# Patient Record
Sex: Female | Born: 1980 | Race: White | Hispanic: No | Marital: Married | Smoking: Never smoker
Health system: Southern US, Community
[De-identification: ages and names within clinical notes are randomized; demographics above are authoritative.]

## PROBLEM LIST (undated history)

## (undated) DIAGNOSIS — Z87448 Personal history of other diseases of urinary system: Secondary | ICD-10-CM

## (undated) DIAGNOSIS — T7840XA Allergy, unspecified, initial encounter: Secondary | ICD-10-CM

## (undated) DIAGNOSIS — Z8619 Personal history of other infectious and parasitic diseases: Secondary | ICD-10-CM

## (undated) DIAGNOSIS — K602 Anal fissure, unspecified: Secondary | ICD-10-CM

## (undated) HISTORY — DX: Anal fissure, unspecified: K60.2

## (undated) HISTORY — DX: Allergy, unspecified, initial encounter: T78.40XA

## (undated) HISTORY — DX: Personal history of other diseases of urinary system: Z87.448

## (undated) HISTORY — PX: NO PAST SURGERIES: SHX2092

## (undated) HISTORY — PX: COLONOSCOPY: SHX174

## (undated) HISTORY — DX: Personal history of other infectious and parasitic diseases: Z86.19

## (undated) HISTORY — PX: WISDOM TOOTH EXTRACTION: SHX21

---
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
## 2001-01-04 ENCOUNTER — Inpatient Hospital Stay (HOSPITAL_COMMUNITY): Admission: AD | Admit: 2001-01-04 | Discharge: 2001-01-06 | Payer: Self-pay | Admitting: Obstetrics and Gynecology

## 2002-01-03 ENCOUNTER — Ambulatory Visit (HOSPITAL_COMMUNITY): Admission: RE | Admit: 2002-01-03 | Discharge: 2002-01-03 | Payer: Self-pay | Admitting: Obstetrics and Gynecology

## 2002-01-03 ENCOUNTER — Encounter: Payer: Self-pay | Admitting: Obstetrics and Gynecology

## 2002-04-09 ENCOUNTER — Inpatient Hospital Stay (HOSPITAL_COMMUNITY): Admission: AD | Admit: 2002-04-09 | Discharge: 2002-04-11 | Payer: Self-pay | Admitting: Obstetrics and Gynecology

## 2004-04-27 ENCOUNTER — Other Ambulatory Visit: Admission: RE | Admit: 2004-04-27 | Discharge: 2004-04-27 | Payer: Self-pay | Admitting: Obstetrics and Gynecology

## 2005-01-27 ENCOUNTER — Other Ambulatory Visit: Admission: RE | Admit: 2005-01-27 | Discharge: 2005-01-27 | Payer: Self-pay | Admitting: Obstetrics and Gynecology

## 2015-04-23 LAB — OB RESULTS CONSOLE GC/CHLAMYDIA
Chlamydia: NEGATIVE
Gonorrhea: NEGATIVE

## 2015-04-23 LAB — OB RESULTS CONSOLE RUBELLA ANTIBODY, IGM: Rubella: IMMUNE

## 2015-04-23 LAB — OB RESULTS CONSOLE ABO/RH: RH Type: POSITIVE

## 2015-04-23 LAB — OB RESULTS CONSOLE HEPATITIS B SURFACE ANTIGEN: Hepatitis B Surface Ag: NEGATIVE

## 2015-04-23 LAB — OB RESULTS CONSOLE HIV ANTIBODY (ROUTINE TESTING): HIV: NONREACTIVE

## 2015-04-23 LAB — OB RESULTS CONSOLE RPR: RPR: NONREACTIVE

## 2015-04-23 LAB — OB RESULTS CONSOLE ANTIBODY SCREEN: Antibody Screen: NEGATIVE

## 2015-08-30 NOTE — L&D Delivery Note (Signed)
Delivery Note Pt pushed well about 30 minutes and at 7:31 PM a viable female was delivered via Vaginal, Spontaneous Delivery (Presentation: Right Occiput Anterior).  APGAR: 8, 9; weight  .   Placenta status: delivered with partial manual extraction, fundal check done after delivery with no residual placenta felt but fundus was very firm and clamped down.  Cord: 3 vessels with the following complications: Tight double nuchal delivered through.  Anesthesia: Epidural  Episiotomy:  none Lacerations: none  Suture Repair: n/a Est. Blood Loss (mL):  2107ml  Mom to postpartum.  Baby to Couplet care / Skin to Skin. D/w pt and husband circumcision and they desire to proceed.  Logan Bores 11/26/2015, 7:54 PM

## 2015-11-18 LAB — OB RESULTS CONSOLE GBS: GBS: POSITIVE

## 2015-11-22 ENCOUNTER — Inpatient Hospital Stay (HOSPITAL_COMMUNITY): Admission: AD | Admit: 2015-11-22 | Payer: Self-pay | Source: Ambulatory Visit | Admitting: Obstetrics and Gynecology

## 2015-11-23 ENCOUNTER — Telehealth (HOSPITAL_COMMUNITY): Payer: Self-pay | Admitting: *Deleted

## 2015-11-23 ENCOUNTER — Encounter (HOSPITAL_COMMUNITY): Payer: Self-pay | Admitting: *Deleted

## 2015-11-23 NOTE — Telephone Encounter (Signed)
Preadmission screen  

## 2015-11-25 NOTE — H&P (Signed)
Heather Nixon is a 35 y.o. female  G3P2002 at 103 4/7 weeks (EDD 11/22/15 by LMP c/w 9 week [place])   presenting for IOL at term with favorable cervix.  Prenatal care uncomplicated except GBS positive.  Maternal Medical History:  Contractions: Frequency: irregular.   Perceived severity is mild.    Fetal activity: Perceived fetal activity is normal.    Prenatal Complications - Diabetes: none.    OB History    Gravida Para Term Preterm AB TAB SAB Ectopic Multiple Living   3 2 2       2     NSVD x 2 2002, 2003    Past Medical History  Diagnosis Date  . Hx of pyelonephritis   . Hx of varicella    Past Surgical History  Procedure Laterality Date  . No past surgeries     Family History: family history includes Hypertension in her maternal grandmother. There is no history of Alcohol abuse, Arthritis, Asthma, Birth defects, Cancer, COPD, Depression, Diabetes, Drug abuse, Early death, Hearing loss, Heart disease, Hyperlipidemia, Kidney disease, Learning disabilities, Mental illness, Mental retardation, Miscarriages / Stillbirths, Stroke, Vision loss, or Varicose Veins. Social History:  reports that she has never smoked. She has never used smokeless tobacco. She reports that she does not drink alcohol or use illicit drugs.   Prenatal Transfer Tool  Maternal Diabetes: No Genetic Screening: Normal Maternal Ultrasounds/Referrals: Normal Fetal Ultrasounds or other Referrals:  None Maternal Substance Abuse:  No Significant Maternal Medications:  None Significant Maternal Lab Results:  Lab values include: Group B Strep positive Other Comments:  None  Review of Systems  Gastrointestinal: Negative for abdominal pain.      Last menstrual period 02/15/2015. Maternal Exam:  Uterine Assessment: Contraction strength is mild.  Contraction frequency is irregular.   Abdomen: Fetal presentation: vertex  Introitus: Normal vulva. Normal vagina.    Physical Exam  Constitutional: She appears  well-developed and well-nourished.  Cardiovascular: Normal rate and regular rhythm.   Respiratory: Effort normal.  GI: Soft. Bowel sounds are normal.  Genitourinary: Vagina normal.  Neurological: She is alert.  Psychiatric: She has a normal mood and affect.    Prenatal labs: ABO, Rh: O/Positive/-- (08/25 0000) Antibody: Negative (08/25 0000) Rubella: Immune (08/25 0000) RPR: Nonreactive (08/25 0000)  HBsAg: Negative (08/25 0000)  HIV: Non-reactive (08/25 0000)  GBS: Positive (03/22 0000)  First trimester screen negative One hour GCT 90  Assessment/Plan: Pt for IOL at term.  Plan AROM and Pitocin after PCN on board for +GBS. Epidural prn.  Logan Bores 11/25/2015, 10:29 PM

## 2015-11-26 ENCOUNTER — Encounter (HOSPITAL_COMMUNITY): Payer: Self-pay

## 2015-11-26 ENCOUNTER — Inpatient Hospital Stay (HOSPITAL_COMMUNITY)
Admission: RE | Admit: 2015-11-26 | Discharge: 2015-11-28 | DRG: 775 | Disposition: A | Discharge status: Home / Self Care | Payer: Managed Care, Other (non HMO) | Source: Ambulatory Visit | Attending: Obstetrics and Gynecology | Admitting: Obstetrics and Gynecology

## 2015-11-26 ENCOUNTER — Inpatient Hospital Stay (HOSPITAL_COMMUNITY): Payer: Managed Care, Other (non HMO) | Admitting: Anesthesiology

## 2015-11-26 DIAGNOSIS — O99824 Streptococcus B carrier state complicating childbirth: Secondary | ICD-10-CM | POA: Diagnosis present

## 2015-11-26 DIAGNOSIS — Z3A40 40 weeks gestation of pregnancy: Secondary | ICD-10-CM

## 2015-11-26 DIAGNOSIS — Z8249 Family history of ischemic heart disease and other diseases of the circulatory system: Secondary | ICD-10-CM | POA: Diagnosis not present

## 2015-11-26 DIAGNOSIS — Z3490 Encounter for supervision of normal pregnancy, unspecified, unspecified trimester: Secondary | ICD-10-CM

## 2015-11-26 LAB — CBC
HCT: 36.6 % (ref 36.0–46.0)
HEMOGLOBIN: 12.6 g/dL (ref 12.0–15.0)
MCH: 32.1 pg (ref 26.0–34.0)
MCHC: 34.4 g/dL (ref 30.0–36.0)
MCV: 93.4 fL (ref 78.0–100.0)
Platelets: 220 10*3/uL (ref 150–400)
RBC: 3.92 MIL/uL (ref 3.87–5.11)
RDW: 13 % (ref 11.5–15.5)
WBC: 11.1 10*3/uL — AB (ref 4.0–10.5)

## 2015-11-26 LAB — ABO/RH: ABO/RH(D): O POS

## 2015-11-26 LAB — TYPE AND SCREEN
ABO/RH(D): O POS
ANTIBODY SCREEN: NEGATIVE

## 2015-11-26 MED ORDER — LIDOCAINE HCL (PF) 1 % IJ SOLN
INTRAMUSCULAR | Status: DC | PRN
  Administered 2015-11-26 (×2): 4 mL

## 2015-11-26 MED ORDER — ONDANSETRON HCL 4 MG/2ML IJ SOLN: 4.0000 mg | Freq: Four times a day (QID) | INTRAMUSCULAR | Status: DC | PRN

## 2015-11-26 MED ORDER — LACTATED RINGERS IV SOLN
500.0000 mL | Freq: Once | INTRAVENOUS | Status: AC
  Administered 2015-11-26: 500 mL via INTRAVENOUS

## 2015-11-26 MED ORDER — LORATADINE 10 MG PO TABS: 10.0000 mg | ORAL_TABLET | Freq: Every day | ORAL | Status: DC | PRN

## 2015-11-26 MED ORDER — BENZOCAINE-MENTHOL 20-0.5 % EX AERO: 1.0000 "application " | INHALATION_SPRAY | CUTANEOUS | Status: DC | PRN

## 2015-11-26 MED ORDER — DIBUCAINE 1 % RE OINT: 1.0000 "application " | TOPICAL_OINTMENT | RECTAL | Status: DC | PRN

## 2015-11-26 MED ORDER — DIPHENHYDRAMINE HCL 50 MG/ML IJ SOLN: 12.5000 mg | INTRAMUSCULAR | Status: DC | PRN

## 2015-11-26 MED ORDER — LACTATED RINGERS IV SOLN: 500.0000 mL | INTRAVENOUS | Status: DC | PRN

## 2015-11-26 MED ORDER — OXYCODONE-ACETAMINOPHEN 5-325 MG PO TABS: 2.0000 | ORAL_TABLET | ORAL | Status: DC | PRN

## 2015-11-26 MED ORDER — LACTATED RINGERS IV SOLN
1.0000 m[IU]/min | INTRAVENOUS | Status: DC
  Administered 2015-11-26: 2 m[IU]/min via INTRAVENOUS
  Filled 2015-11-26: qty 10

## 2015-11-26 MED ORDER — PRENATAL MULTIVITAMIN CH
1.0000 | ORAL_TABLET | Freq: Every day | ORAL | Status: DC
  Filled 2015-11-26: qty 1

## 2015-11-26 MED ORDER — PENICILLIN G POTASSIUM 5000000 UNITS IJ SOLR
2.5000 10*6.[IU] | INTRAVENOUS | Status: DC
  Administered 2015-11-26: 2.5 10*6.[IU] via INTRAVENOUS
  Filled 2015-11-26 (×4): qty 2.5

## 2015-11-26 MED ORDER — LANOLIN HYDROUS EX OINT: TOPICAL_OINTMENT | CUTANEOUS | Status: DC | PRN

## 2015-11-26 MED ORDER — CITRIC ACID-SODIUM CITRATE 334-500 MG/5ML PO SOLN: 30.0000 mL | ORAL | Status: DC | PRN

## 2015-11-26 MED ORDER — SIMETHICONE 80 MG PO CHEW: 80.0000 mg | CHEWABLE_TABLET | ORAL | Status: DC | PRN

## 2015-11-26 MED ORDER — DIPHENHYDRAMINE HCL 25 MG PO CAPS: 25.0000 mg | ORAL_CAPSULE | Freq: Four times a day (QID) | ORAL | Status: DC | PRN

## 2015-11-26 MED ORDER — PHENYLEPHRINE 40 MCG/ML (10ML) SYRINGE FOR IV PUSH (FOR BLOOD PRESSURE SUPPORT)
80.0000 ug | PREFILLED_SYRINGE | INTRAVENOUS | Status: DC | PRN
  Filled 2015-11-26: qty 2

## 2015-11-26 MED ORDER — SENNOSIDES-DOCUSATE SODIUM 8.6-50 MG PO TABS
2.0000 | ORAL_TABLET | ORAL | Status: DC
  Administered 2015-11-27 (×2): 2 via ORAL
  Filled 2015-11-26 (×2): qty 2

## 2015-11-26 MED ORDER — PHENYLEPHRINE 40 MCG/ML (10ML) SYRINGE FOR IV PUSH (FOR BLOOD PRESSURE SUPPORT)
80.0000 ug | PREFILLED_SYRINGE | INTRAVENOUS | Status: DC | PRN
  Filled 2015-11-26: qty 20
  Filled 2015-11-26: qty 2

## 2015-11-26 MED ORDER — WITCH HAZEL-GLYCERIN EX PADS: 1.0000 "application " | MEDICATED_PAD | CUTANEOUS | Status: DC | PRN

## 2015-11-26 MED ORDER — IBUPROFEN 600 MG PO TABS
600.0000 mg | ORAL_TABLET | Freq: Four times a day (QID) | ORAL | Status: DC
  Administered 2015-11-27 – 2015-11-28 (×6): 600 mg via ORAL
  Filled 2015-11-26 (×6): qty 1

## 2015-11-26 MED ORDER — ACETAMINOPHEN 325 MG PO TABS: 650.0000 mg | ORAL_TABLET | ORAL | Status: DC | PRN

## 2015-11-26 MED ORDER — TETANUS-DIPHTH-ACELL PERTUSSIS 5-2.5-18.5 LF-MCG/0.5 IM SUSP: 0.5000 mL | Freq: Once | INTRAMUSCULAR | Status: DC

## 2015-11-26 MED ORDER — DEXTROSE 5 % IV SOLN
5.0000 10*6.[IU] | Freq: Once | INTRAVENOUS | Status: AC
  Administered 2015-11-26: 5 10*6.[IU] via INTRAVENOUS
  Filled 2015-11-26: qty 5

## 2015-11-26 MED ORDER — ONDANSETRON HCL 4 MG PO TABS: 4.0000 mg | ORAL_TABLET | ORAL | Status: DC | PRN

## 2015-11-26 MED ORDER — FENTANYL 2.5 MCG/ML BUPIVACAINE 1/10 % EPIDURAL INFUSION (WH - ANES)
14.0000 mL/h | INTRAMUSCULAR | Status: DC | PRN
  Administered 2015-11-26 (×2): 14 mL/h via EPIDURAL
  Filled 2015-11-26: qty 125

## 2015-11-26 MED ORDER — LIDOCAINE HCL (PF) 1 % IJ SOLN
30.0000 mL | INTRAMUSCULAR | Status: DC | PRN
  Filled 2015-11-26: qty 30

## 2015-11-26 MED ORDER — OXYTOCIN 10 UNIT/ML IJ SOLN: 2.5000 [IU]/h | INTRAMUSCULAR | Status: DC

## 2015-11-26 MED ORDER — ZOLPIDEM TARTRATE 5 MG PO TABS: 5.0000 mg | ORAL_TABLET | Freq: Every evening | ORAL | Status: DC | PRN

## 2015-11-26 MED ORDER — OXYCODONE-ACETAMINOPHEN 5-325 MG PO TABS: 1.0000 | ORAL_TABLET | ORAL | Status: DC | PRN

## 2015-11-26 MED ORDER — TERBUTALINE SULFATE 1 MG/ML IJ SOLN
0.2500 mg | Freq: Once | INTRAMUSCULAR | Status: DC | PRN
  Filled 2015-11-26: qty 1

## 2015-11-26 MED ORDER — EPHEDRINE 5 MG/ML INJ
10.0000 mg | INTRAVENOUS | Status: DC | PRN
  Filled 2015-11-26: qty 2

## 2015-11-26 MED ORDER — ACETAMINOPHEN 325 MG PO TABS
650.0000 mg | ORAL_TABLET | ORAL | Status: DC | PRN
  Administered 2015-11-27 (×2): 650 mg via ORAL
  Filled 2015-11-26 (×2): qty 2

## 2015-11-26 MED ORDER — OXYTOCIN BOLUS FROM INFUSION
500.0000 mL | INTRAVENOUS | Status: DC
  Administered 2015-11-26: 500 mL via INTRAVENOUS

## 2015-11-26 MED ORDER — LACTATED RINGERS IV SOLN
INTRAVENOUS | Status: DC
  Administered 2015-11-26 (×2): via INTRAVENOUS

## 2015-11-26 MED ORDER — ONDANSETRON HCL 4 MG/2ML IJ SOLN: 4.0000 mg | INTRAMUSCULAR | Status: DC | PRN

## 2015-11-26 NOTE — Anesthesia Preprocedure Evaluation (Signed)
Anesthesia Evaluation  Patient identified by MRN, date of birth, ID band Patient awake    Reviewed: Allergy & Precautions, NPO status , Patient's Chart, lab work & pertinent test results  Airway Mallampati: II  TM Distance: >3 FB Neck ROM: Full    Dental no notable dental hx. (+) Dental Advisory Given   Pulmonary neg pulmonary ROS,    Pulmonary exam normal breath sounds clear to auscultation       Cardiovascular negative cardio ROS Normal cardiovascular exam Rhythm:Regular Rate:Normal     Neuro/Psych negative neurological ROS  negative psych ROS   GI/Hepatic negative GI ROS, Neg liver ROS,   Endo/Other  negative endocrine ROS  Renal/GU negative Renal ROS  negative genitourinary   Musculoskeletal negative musculoskeletal ROS (+)   Abdominal   Peds negative pediatric ROS (+)  Hematology negative hematology ROS (+)   Anesthesia Other Findings   Reproductive/Obstetrics (+) Pregnancy                             Anesthesia Physical Anesthesia Plan  ASA: II  Anesthesia Plan: Epidural   Post-op Pain Management:    Induction: Intravenous  Airway Management Planned:   Additional Equipment:   Intra-op Plan:   Post-operative Plan:   Informed Consent: I have reviewed the patients History and Physical, chart, labs and discussed the procedure including the risks, benefits and alternatives for the proposed anesthesia with the patient or authorized representative who has indicated his/her understanding and acceptance.   Dental advisory given  Plan Discussed with: CRNA  Anesthesia Plan Comments:         Anesthesia Quick Evaluation

## 2015-11-26 NOTE — Progress Notes (Signed)
Patient ID: Heather Nixon, female   DOB: Jul 23, 1981, 35 y.o.   MRN: [id number] Pt just received epidural and comfortable.   afeb VSS  Cervix c/5/-1  FHR category 1  IUPC placed to adjust pitocin Fllow progress, getting into active phase labor

## 2015-11-26 NOTE — Progress Notes (Signed)
Patient ID: Heather Nixon, female   DOB: Sep 04, 1980, 35 y.o.   MRN: [id number] Pt just starting to get uncomfortable with contractions  afeb vss FHR Category 1  70/3/-1  Continue  pitocin and PCN

## 2015-11-26 NOTE — Anesthesia Procedure Notes (Signed)
Epidural Patient location during procedure: OB  Staffing Anesthesiologist: Aryan Bello Performed by: anesthesiologist   Preanesthetic Checklist Completed: patient identified, site marked, surgical consent, pre-op evaluation, timeout performed, IV checked, risks and benefits discussed and monitors and equipment checked  Epidural Patient position: sitting Prep: site prepped and draped and DuraPrep Patient monitoring: continuous pulse ox and blood pressure Approach: midline Location: L3-L4 Injection technique: LOR saline  Needle:  Needle type: Tuohy  Needle gauge: 17 G Needle length: 9 cm and 9 Needle insertion depth: 5 cm cm Catheter type: closed end flexible Catheter size: 19 Gauge Catheter at skin depth: 10 cm Test dose: negative  Assessment Events: blood not aspirated, injection not painful, no injection resistance, negative IV test and no paresthesia  Additional Notes Patient identified. Risks/Benefits/Options discussed with patient including but not limited to bleeding, infection, nerve damage, paralysis, failed block, incomplete pain control, headache, blood pressure changes, nausea, vomiting, reactions to medication both or allergic, itching and postpartum back pain. Confirmed with bedside nurse the patient's most recent platelet count. Confirmed with patient that they are not currently taking any anticoagulation, have any bleeding history or any family history of bleeding disorders. Patient expressed understanding and wished to proceed. All questions were answered. Sterile technique was used throughout the entire procedure. Please see nursing notes for vital signs. Test dose was given through epidural catheter and negative prior to continuing to dose epidural or start infusion. Warning signs of high block given to the patient including shortness of breath, tingling/numbness in hands, complete motor block, or any concerning symptoms with instructions to call for help. Patient was  given instructions on fall risk and not to get out of bed. All questions and concerns addressed with instructions to call with any issues or inadequate analgesia.      

## 2015-11-26 NOTE — Progress Notes (Signed)
Patient ID: Heather Nixon, female   DOB: 07-Dec-1980, 35 y.o.   MRN: [id number] Pt admitted and PCN for +GBS going FHR Category 1 Cervix 70/2/-2 AROM clear  Begin pitocin, epidural prn.

## 2015-11-27 LAB — CBC
HEMATOCRIT: 31 % — AB (ref 36.0–46.0)
Hemoglobin: 10.8 g/dL — ABNORMAL LOW (ref 12.0–15.0)
MCH: 32.7 pg (ref 26.0–34.0)
MCHC: 34.8 g/dL (ref 30.0–36.0)
MCV: 93.9 fL (ref 78.0–100.0)
Platelets: 170 10*3/uL (ref 150–400)
RBC: 3.3 MIL/uL — ABNORMAL LOW (ref 3.87–5.11)
RDW: 13.1 % (ref 11.5–15.5)
WBC: 13.3 10*3/uL — ABNORMAL HIGH (ref 4.0–10.5)

## 2015-11-27 LAB — CCBB MATERNAL DONOR DRAW

## 2015-11-27 LAB — RPR: RPR: NONREACTIVE

## 2015-11-27 NOTE — Lactation Note (Signed)
This note was copied from a baby's chart. Lactation Consultation Note Follow up visit at 24 hours of age.  Mom reports baby is fussy and latching for 60 minutes and not satisfied.  Baby has had several voids and stools, mom does report baby to be a little gaggy.  [place] assisted mom with hand expression of about 63mls.  LC instructed mom on proper application of NS.  MOm reports not needing NS on right breast with tissue more compressible.  Baby does not suck well on gloved finger with tongue thrusting.  Observed heart shaped tip of tongue.   LC changed a stooled diaper, FOB reports just changing one.  Discussed suck training with mom.  LC assisted with latch in football hold on left breast.  Baby sucked with stimulation for about 5 minutes. Baby made swallow sounds, but colostrum was not visible in NS when baby unlatched.  Baby does not appear eager to suck.  LC assisted with spoon feeding,  Baby continues to not display hunger cues.  Discussed other ways to soothe baby as he does not appear to by hungry at this time.  Encouraged parents to delay formula while working on breastfeeding and if they do offer formula to offer by spoon or syringe to help with breastfeeding.  LC offered all education and parents should be given formula if requested again.  LC did not finish offering colostrum at bedside due to baby not being interested at this time.  Encouraged mom to keep pumping after feedings and work on hand expression to offer EBM.  Baby calm in moms arms.  Mom to call for assist as needed.   RN reports to [facility] parents requesting formula and not wanting to feel guilty about it.  RN to offer formula supplement.    Patient Name: Heather Nixon [id number] Date: 11/27/2015 Reason for consult: Follow-up assessment;Difficult latch   Maternal Data Has patient been taught Hand Expression?: Yes  Feeding Feeding Type: Breast Fed Length of feed: 5 min  LATCH Score/Interventions Latch: Repeated attempts needed to  sustain latch, nipple held in mouth throughout feeding, stimulation needed to elicit sucking reflex. Intervention(s): Adjust position;Assist with latch;Breast massage;Breast compression  Audible Swallowing: A few with stimulation Intervention(s): Skin to skin;Hand expression  Type of Nipple: Everted at rest and after stimulation  Comfort (Breast/Nipple): Soft / non-tender     Hold (Positioning): Assistance needed to correctly position infant at breast and maintain latch. Intervention(s): Breastfeeding basics reviewed;Support Pillows;Position options;Skin to skin  LATCH Score: 7  Lactation Tools Discussed/Used Tools: Nipple Shields Nipple shield size: 16 Pump Review: Setup, frequency, and cleaning;Milk Storage Initiated by:: Elveria Rising RN  Date initiated:: 11/27/15   Consult Status Consult Status: Follow-up Date: 11/28/15 Follow-up type: In-patient    Justice Britain 11/27/2015, 8:08 PM

## 2015-11-27 NOTE — Lactation Note (Signed)
This note was copied from a baby's chart. Lactation Consultation Note Moms youngest child at home is 34 yrs old and BF her for 2-3 months. Mom didn't BF her 1st child. Mom has short shaft nipples and baby can't at this time obtain a deep latch. Nipple compresses but baby can't subsatin a deep latch and lets go and ends on tip of nipples. Has small bruise to Rt. Nipple. Fitted mom w/#16 NS. Encouraged mom to wear shells that were given to assist in everting nipples. Gave hand pump to pre-pump to evert nipple to apply NS. I feel the shield if temporary if nipples become more compressible. Mom encouraged to feed baby 8-12 times/24 hours and with feeding cues. Referred to Baby and Me Book in Breastfeeding section Pg. 22-23 for position options and Proper latch demonstration.Mom encouraged to do skin-to-skin. Educated about newborn behavior. [place] brochure given w/resources, support groups and [facility] services. Patient Name: Heather Nixon [id number] Date: 11/27/2015 Reason for consult: Initial assessment   Maternal Data Has patient been taught Hand Expression?: Yes Does the patient have breastfeeding experience prior to this delivery?: Yes  Feeding Feeding Type: Breast Fed Length of feed: 10 min (still BF)  LATCH Score/Interventions Latch: Repeated attempts needed to sustain latch, nipple held in mouth throughout feeding, stimulation needed to elicit sucking reflex. Intervention(s): Adjust position;Assist with latch;Breast massage;Breast compression  Audible Swallowing: None Intervention(s): Hand expression  Type of Nipple: Everted at rest and after stimulation (short shaft)  Comfort (Breast/Nipple): Filling, red/small blisters or bruises, mild/mod discomfort  Problem noted: Mild/Moderate discomfort Interventions (Mild/moderate discomfort): Breast shields;Pre-pump if needed;Hand massage;Hand expression  Hold (Positioning): Assistance needed to correctly position infant at breast and maintain  latch. Intervention(s): Skin to skin;Position options;Support Pillows;Breastfeeding basics reviewed  LATCH Score: 5  Lactation Tools Discussed/Used Tools: Shells;Nipple [place] Fuel;Pump Nipple shield size: 16 Shell Type: Inverted Breast pump type: Manual Pump Review: Setup, frequency, and cleaning;Milk Storage Initiated by:: L> Hercules Hasler RN Date initiated:: 11/27/15   Consult Status Consult Status: Follow-up Date: 11/27/15 (in pm) Follow-up type: In-patient    Rodina Pinales, Elta [place] 11/27/2015, 6:09 AM

## 2015-11-27 NOTE — Progress Notes (Signed)
Post Partum Day 1 Subjective: no complaints, up ad lib and tolerating PO  Objective: Blood pressure 124/70, pulse 80, temperature 98.3 F (36.8 C), temperature source Oral, resp. rate 18, height 5\' 10"  (1.778 m), weight 94.802 kg (209 lb), last menstrual period 02/15/2015, SpO2 100 %, unknown if currently breastfeeding.  Physical Exam:  General: alert and cooperative Lochia: appropriate Uterine Fundus: firm    Recent Labs  11/26/15 0910 11/27/15 0531  HGB 12.6 10.8*  HCT 36.6 31.0*    Assessment/Plan: Plan for discharge tomorrow   LOS: 1 day   Jaxten Brosh W 11/27/2015, 8:50 AM

## 2015-11-27 NOTE — Anesthesia Postprocedure Evaluation (Signed)
Anesthesia Post Note  Patient: Heather Nixon  Procedure(s) Performed: * No procedures listed *  Patient location during evaluation: Mother Baby Anesthesia Type: Epidural Level of consciousness: awake and alert and oriented Pain management: satisfactory to patient Vital Signs Assessment: post-procedure vital signs reviewed and stable Respiratory status: spontaneous breathing and nonlabored ventilation Cardiovascular status: stable Postop Assessment: no headache, no backache, no signs of nausea or vomiting, adequate PO intake and patient able to bend at knees (patient up walking) Anesthetic complications: no    Last Vitals:  Filed Vitals:   11/26/15 2215 11/27/15 0226  BP: 117/68 124/70  Pulse: 91 80  Temp: 36.9 C 36.8 C  Resp: 18 18    Last Pain:  Filed Vitals:   11/27/15 0552  PainSc: 3                  Jansel Vonstein

## 2015-11-28 MED ORDER — IBUPROFEN 600 MG PO TABS: 600.0000 mg | ORAL_TABLET | Freq: Four times a day (QID) | ORAL | Status: DC

## 2015-11-28 NOTE — Discharge Instructions (Signed)
As per discharge pamphlet °

## 2015-11-28 NOTE — Discharge Summary (Signed)
OB Discharge Summary     Patient Name: Heather Nixon DOB: 09-05-1980 MRN: [id number]  Date of admission: 11/26/2015 Delivering MD: Paula Compton   Date of discharge: 11/28/2015  Admitting diagnosis: INDUCTION Intrauterine pregnancy: [redacted]w[redacted]d     Secondary diagnosis:  Active Problems:   Term pregnancy   NSVD (normal spontaneous vaginal delivery)      Discharge diagnosis: Term Pregnancy Delivered                                                                                                 Augmentation: AROM and Pitocin  Complications: None  Hospital course:  Induction of Labor With Vaginal Delivery   35 y.o. yo G3P3003 at [redacted]w[redacted]d was admitted to the hospital 11/26/2015 for induction of labor.  Indication for induction: Favorable cervix at term.  Patient had an uncomplicated labor course as follows: Membrane Rupture Time/Date: 9:30 AM ,11/26/2015   Intrapartum Procedures: Episiotomy: None [1]                                         Lacerations:  None [1]  Patient had delivery of a Viable infant.  Information for the patient's newborn:  [place], [place] [id number]  Delivery Method: Vag-Spont   11/26/2015  Details of delivery can be found in separate delivery note.  Patient had a routine postpartum course. Patient is discharged home 11/28/2015.   Physical exam  Filed Vitals:   11/27/15 0226 11/27/15 1017 11/27/15 1832 11/28/15 0611  BP: 124/70 120/69 118/73 124/70  Pulse: 80 69 64 57  Temp: 98.3 F (36.8 C) 98 F (36.7 C) 97.7 F (36.5 C) 98.3 F (36.8 C)  TempSrc: Oral Oral Oral Oral  Resp: 18 20 17 20   Height:      Weight:      SpO2:       General: alert Lochia: appropriate Uterine Fundus: firm  Labs: Lab Results  Component Value Date   WBC 13.3* 11/27/2015   HGB 10.8* 11/27/2015   HCT 31.0* 11/27/2015   MCV 93.9 11/27/2015   PLT 170 11/27/2015   No flowsheet data found.  Discharge instruction: per After Visit Summary and "Baby and Me  Booklet".  After visit meds:    Medication List    STOP taking these medications        ranitidine 150 MG capsule  Commonly known as:  ZANTAC      TAKE these medications        ibuprofen 600 MG tablet  Commonly known as:  ADVIL,MOTRIN  Take 1 tablet (600 mg total) by mouth every 6 (six) hours.     loratadine 10 MG tablet  Commonly known as:  CLARITIN  Take 10 mg by mouth daily as needed for allergies.     prenatal multivitamin Tabs tablet  Take 1 tablet by mouth daily at 12 noon.        Diet: routine diet  Activity: Advance as tolerated. Pelvic rest for 6 weeks.   Outpatient follow  up:6 weeks  Newborn Data: Live born female  Birth Weight: 8 lb 8.3 oz (3864 g) APGAR: 8, 9  Baby Feeding: Breast Disposition:home with mother   11/28/2015 Clarene Duke, MD

## 2015-11-28 NOTE — Progress Notes (Signed)
PPD #2 Doing well Afeb, VSS D/c home 

## 2016-04-14 ENCOUNTER — Encounter: Payer: Self-pay | Admitting: Internal Medicine

## 2016-06-21 ENCOUNTER — Encounter (INDEPENDENT_AMBULATORY_CARE_PROVIDER_SITE_OTHER): Payer: Self-pay

## 2016-06-21 ENCOUNTER — Encounter: Payer: Self-pay | Admitting: Internal Medicine

## 2016-06-21 ENCOUNTER — Ambulatory Visit (INDEPENDENT_AMBULATORY_CARE_PROVIDER_SITE_OTHER): Payer: Managed Care, Other (non HMO) | Admitting: Internal Medicine

## 2016-06-21 VITALS — BP 118/78 | HR 76 | Ht 70.0 in | Wt 183.2 lb

## 2016-06-21 DIAGNOSIS — K602 Anal fissure, unspecified: Secondary | ICD-10-CM | POA: Diagnosis not present

## 2016-06-21 DIAGNOSIS — Z800 Family history of malignant neoplasm of digestive organs: Secondary | ICD-10-CM | POA: Diagnosis not present

## 2016-06-21 MED ORDER — DILTIAZEM GEL 2 %: 1.0000 "application " | Freq: Every day | CUTANEOUS | 2 refills | Status: DC

## 2016-06-21 MED ORDER — NA SULFATE-K SULFATE-MG SULF 17.5-3.13-1.6 GM/177ML PO SOLN: 1.0000 | Freq: Once | ORAL | 0 refills | Status: AC

## 2016-06-21 NOTE — Progress Notes (Signed)
HISTORY OF PRESENT ILLNESS:  Heather SINER is a 35 y.o. female , daughter Delice Lesch, who presents her self today regarding constipation, rectal pain, rectal bleeding, and a family history of colon cancer. Also GERD. Patient reports several year history of intermittent problems with constipation, rectal pain with defecation, and rectal bleeding principally manifested as red blood on the tissue. She has treated herself with daily Metamucil and as needed MiraLAX with 1-2 bowel movements currently. She does notice that her stool caliber is thinner than normal. She was evaluated at urgent care 1. for left-sided abdominal pain. Patient reports having problems with rectal pain as recently as one week ago. Her father was diagnosed with colon cancer at age 53. History of multiple polyposis as well. Patient denies recent problems with abdominal pain or weight loss. Had a baby 7 months ago. She also reports problems with GERD as manifested by regurgitation and pyrosis. No dysphagia. Symptoms worse during pregnancy. Currently takes infrequent on-demand PPI at night as well as antacids. GI review of systems otherwise negative  REVIEW OF SYSTEMS:  All non-GI ROS negative except for sinus allergy, urinary frequency  Past Medical History:  Diagnosis Date  . Hx of pyelonephritis   . Hx of varicella     Past Surgical History:  Procedure Laterality Date  . NO PAST SURGERIES      Social History MARYJEAN QUAMME  reports that she has never smoked. She has never used smokeless tobacco. She reports that she does not drink alcohol or use drugs.  family history includes Colon cancer in her father and other; Hypertension in her maternal grandmother.  Allergies  Allergen Reactions  . Macrobid [employer] Macro] Nausea And Vomiting       PHYSICAL EXAMINATION: Vital signs: BP 118/78   Pulse 76   Ht 5\' 10"  (1.778 m)   Wt 183 lb 4 oz (83.1 kg)   LMP 06/07/2016 (Approximate)   BMI  26.29 kg/m   Constitutional: generally well-appearing, no acute distress Psychiatric: alert and oriented x3, cooperative Eyes: extraocular movements intact, anicteric, conjunctiva pink Mouth: oral pharynx moist, no lesions Neck: supple no lymphadenopathy Cardiovascular: heart regular rate and rhythm, no murmur Lungs: clear to auscultation bilaterally Abdomen: soft, nontender, nondistended, no obvious ascites, no peritoneal signs, normal bowel sounds, no organomegaly Rectal:Anterior anal fissure with associated tenderness. No other abnormalities. Brown stool Extremities: no clubbing cyanosis or lower extremity edema bilaterally Skin: no lesions on visible extremities Neuro: No focal deficits. Cranial nerves intact  ASSESSMENT:  #1. Anal fissure #2. Family history of colon cancer in her father at age 6 #3. GERD without alarm features  PLAN: #1. Daily Metamucil #2. Sitz baths #3. Prescribe 2% diltiazem cream. Apply 5 times daily as instructed by myself #4. Colonoscopy given family history of colon cancer.The nature of the procedure, as well as the risks, benefits, and alternatives were carefully and thoroughly reviewed with the patient. Ample time for discussion and questions allowed. The patient understood, was satisfied, and agreed to proceed. #5. Reflux precautions with attention to weight loss #6. Discussed on-demand use of PPI. Continued more regularly. Take in the morning 30 mins before meal

## 2016-06-21 NOTE — Patient Instructions (Addendum)
We have sent the following medications to [facility] [phone]  for you to pick up at your convenience:  Diltiazem gel  Use Metamucil 1-2 tablespoons daily\  Use Sitz baths  You have been scheduled for a colonoscopy. Please follow written instructions given to you at your visit today.  Please pick up your prep supplies at the pharmacy within the next 1-3 days. If you use inhalers (even only as needed), please bring them with you on the day of your procedure. Your physician has requested that you go to www.startemmi.com and enter the access code given to you at your visit today. This web site gives a general overview about your procedure. However, you should still follow specific instructions given to you by our office regarding your preparation for the procedure.

## 2016-06-28 ENCOUNTER — Ambulatory Visit (AMBULATORY_SURGERY_CENTER): Payer: Managed Care, Other (non HMO) | Admitting: Internal Medicine

## 2016-06-28 ENCOUNTER — Encounter: Payer: Self-pay | Admitting: Internal Medicine

## 2016-06-28 VITALS — BP 123/69 | HR 77 | Temp 98.0°F | Resp 14 | Ht 70.0 in | Wt 183.0 lb

## 2016-06-28 DIAGNOSIS — D125 Benign neoplasm of sigmoid colon: Secondary | ICD-10-CM

## 2016-06-28 DIAGNOSIS — Z1212 Encounter for screening for malignant neoplasm of rectum: Secondary | ICD-10-CM | POA: Diagnosis not present

## 2016-06-28 DIAGNOSIS — Z1211 Encounter for screening for malignant neoplasm of colon: Secondary | ICD-10-CM | POA: Diagnosis present

## 2016-06-28 DIAGNOSIS — Z800 Family history of malignant neoplasm of digestive organs: Secondary | ICD-10-CM | POA: Diagnosis not present

## 2016-06-28 MED ORDER — SODIUM CHLORIDE 0.9 % IV SOLN: 500.0000 mL | INTRAVENOUS | Status: DC

## 2016-06-28 NOTE — Op Note (Signed)
[place] Patient Name: Heather Nixon Procedure Date: 06/28/2016 10:56 AM MRN: [id number] Endoscopist: Docia Chuck. Henrene Pastor , MD Age: 35 Referring MD:  Date of Birth: 04-03-1981 Gender: Female Account #: [id number] Procedure:                Colonoscopy, with cold snare polypectomy x 1 Indications:              Screening in patient at increased risk: Colorectal                            cancer in father before age 28(age 67) Medicines:                Monitored Anesthesia Care Procedure:                Pre-Anesthesia Assessment:                           - Prior to the procedure, a History and Physical                            was performed, and patient medications and                            allergies were reviewed. The patient's tolerance of                            previous anesthesia was also reviewed. The risks                            and benefits of the procedure and the sedation                            options and risks were discussed with the patient.                            All questions were answered, and informed consent                            was obtained. Prior Anticoagulants: The patient has                            taken no previous anticoagulant or antiplatelet                            agents. ASA Grade Assessment: I - A normal, healthy                            patient. After reviewing the risks and benefits,                            the patient was deemed in satisfactory condition to                            undergo the procedure.  After obtaining informed consent, the colonoscope                            was passed under direct vision. Throughout the                            procedure, the patient's blood pressure, pulse, and                            oxygen saturations were monitored continuously. The                            Model CF-HQ190L [id number]) scope was introduced        through the anus and advanced to the the cecum,                            identified by appendiceal orifice and ileocecal                            valve. The ileocecal valve, appendiceal orifice,                            and rectum were photographed. The quality of the                            bowel preparation was excellent. The colonoscopy                            was performed without difficulty. The patient                            tolerated the procedure well. The bowel preparation                            used was SUPREP. Scope In: 11:08:23 AM Scope Out: 11:22:38 AM Scope Withdrawal Time: 0 hours 10 minutes 1 second  Total Procedure Duration: 0 hours 14 minutes 15 seconds  Findings:                 A 5 mm polyp was found in the sigmoid colon. The                            polyp was removed with a cold snare. Resection and                            retrieval were complete.                           The exam was otherwise without abnormality on                            direct and retroflexion views. Complications:            No immediate complications. Estimated blood loss:  None. Estimated Blood Loss:     Estimated blood loss: none. Impression:               - One 5 mm polyp in the sigmoid colon, removed with                            a cold snare. Resected and retrieved.                           - The examination was otherwise normal on direct                            and retroflexion views. Recommendation:           - Repeat colonoscopy in 5 years for surveillance                            (family history).                           - Patient has a contact number available for                            emergencies. The signs and symptoms of potential                            delayed complications were discussed with the                            patient. Return to normal activities tomorrow.                             Written discharge instructions were provided to the                            patient.                           - Resume previous diet.                           - Continue Other recommendations as outlined during                            your recent office evaluation.                           - Await pathology results. Docia Chuck. Henrene Pastor, MD 06/28/2016 11:27:05 AM This report has been signed electronically.

## 2016-06-28 NOTE — Patient Instructions (Signed)
YOU HAD AN ENDOSCOPIC PROCEDURE TODAY AT [facility]:   Refer to the procedure report that was given to you for any specific questions about what was found during the examination.  If the procedure report does not answer your questions, please call your gastroenterologist to clarify.  If you requested that your care partner not be given the details of your procedure findings, then the procedure report has been included in a sealed envelope for you to review at your convenience later.  YOU SHOULD EXPECT: Some feelings of bloating in the abdomen. Passage of more gas than usual.  Walking can help get rid of the air that was put into your GI tract during the procedure and reduce the bloating. If you had a lower endoscopy (such as a colonoscopy or flexible sigmoidoscopy) you may notice spotting of blood in your stool or on the toilet paper. If you underwent a bowel prep for your procedure, you may not have a normal bowel movement for a few days.  Please Note:  You might notice some irritation and congestion in your nose or some drainage.  This is from the oxygen used during your procedure.  There is no need for concern and it should clear up in a day or so.  SYMPTOMS TO REPORT IMMEDIATELY:   Following lower endoscopy (colonoscopy or flexible sigmoidoscopy):  Excessive amounts of blood in the stool  Significant tenderness or worsening of abdominal pains  Swelling of the abdomen that is new, acute  Fever of 100F or higher   Following upper endoscopy (EGD)  Vomiting of blood or coffee ground material  New chest pain or pain under the shoulder blades  Painful or persistently difficult swallowing  New shortness of breath  Fever of 100F or higher  Black, tarry-looking stools  For urgent or emergent issues, a gastroenterologist can be reached at any hour by calling [phone].   DIET:  We do recommend a small meal at first, but then you may proceed to your regular diet.  Drink  plenty of fluids but you should avoid alcoholic beverages for 24 hours.  ACTIVITY:  You should plan to take it easy for the rest of today and you should NOT DRIVE or use heavy machinery until tomorrow (because of the sedation medicines used during the test).    FOLLOW UP: Our staff will call the number listed on your records the next business day following your procedure to check on you and address any questions or concerns that you may have regarding the information given to you following your procedure. If we do not reach you, we will leave a message.  However, if you are feeling well and you are not experiencing any problems, there is no need to return our call.  We will assume that you have returned to your regular daily activities without incident.  If any biopsies were taken you will be contacted by phone or by letter within the next 1-3 weeks.  Please call us at [phone] if you have not heard about the biopsies in 3 weeks.    SIGNATURES/CONFIDENTIALITY: You and/or your care partner have signed paperwork which will be entered into your electronic medical record.  These signatures attest to the fact that that the information above on your After Visit Summary has been reviewed and is understood.  Full responsibility of the confidentiality of this discharge information lies with you and/or your care-partner.YOU HAD AN ENDOSCOPIC PROCEDURE TODAY AT [facility]:  Refer to the procedure report that was given to you for any specific questions about what was found during the examination.  If the procedure report does not answer your questions, please call your gastroenterologist to clarify.  If you requested that your care partner not be given the details of your procedure findings, then the procedure report has been included in a sealed envelope for you to review at your convenience later.  YOU SHOULD EXPECT: Some feelings of bloating in the abdomen. Passage of more gas than  usual.  Walking can help get rid of the air that was put into your GI tract during the procedure and reduce the bloating. If you had a lower endoscopy (such as a colonoscopy or flexible sigmoidoscopy) you may notice spotting of blood in your stool or on the toilet paper. If you underwent a bowel prep for your procedure, you may not have a normal bowel movement for a few days.  Please Note:  You might notice some irritation and congestion in your nose or some drainage.  This is from the oxygen used during your procedure.  There is no need for concern and it should clear up in a day or so.  SYMPTOMS TO REPORT IMMEDIATELY:   Following lower endoscopy (colonoscopy or flexible sigmoidoscopy):  Excessive amounts of blood in the stool  Significant tenderness or worsening of abdominal pains  Swelling of the abdomen that is new, acute  Fever of 100F or higher  For urgent or emergent issues, a gastroenterologist can be reached at any hour by calling [phone].  Please read all handouts given to you by your recovery nurse.  DIET:  We do recommend a small meal at first, but then you may proceed to your regular diet.  Drink plenty of fluids but you should avoid alcoholic beverages for 24 hours.  ACTIVITY:  You should plan to take it easy for the rest of today and you should NOT DRIVE or use heavy machinery until tomorrow (because of the sedation medicines used during the test).    FOLLOW UP: Our staff will call the number listed on your records the next business day following your procedure to check on you and address any questions or concerns that you may have regarding the information given to you following your procedure. If we do not reach you, we will leave a message.  However, if you are feeling well and you are not experiencing any problems, there is no need to return our call.  We will assume that you have returned to your regular daily activities without incident.  If any biopsies were  taken you will be contacted by phone or by letter within the next 1-3 weeks.  Please call us at [phone] if you have not heard about the biopsies in 3 weeks.    SIGNATURES/CONFIDENTIALITY: You and/or your care partner have signed paperwork which will be entered into your electronic medical record.  These signatures attest to the fact that that the information above on your After Visit Summary has been reviewed and is understood.  Full responsibility of the confidentiality of this discharge information lies with you and/or your care-partner.  Thank you for letting us take care of your healthcare needs today.

## 2016-06-28 NOTE — Progress Notes (Signed)
A and O x3. Report to RN. Tolerated MAC anesthesia well. 

## 2016-06-29 ENCOUNTER — Telehealth: Payer: Self-pay

## 2016-06-29 NOTE — Telephone Encounter (Signed)
Left message on answering machine. 

## 2016-06-30 ENCOUNTER — Encounter: Payer: Self-pay | Admitting: Internal Medicine

## 2017-09-15 DIAGNOSIS — M7918 Myalgia, other site: Secondary | ICD-10-CM | POA: Diagnosis not present

## 2017-09-15 DIAGNOSIS — R0789 Other chest pain: Secondary | ICD-10-CM | POA: Diagnosis not present

## 2017-11-09 DIAGNOSIS — J111 Influenza due to unidentified influenza virus with other respiratory manifestations: Secondary | ICD-10-CM | POA: Diagnosis not present

## 2017-12-26 DIAGNOSIS — D7589 Other specified diseases of blood and blood-forming organs: Secondary | ICD-10-CM | POA: Diagnosis not present

## 2017-12-26 DIAGNOSIS — R030 Elevated blood-pressure reading, without diagnosis of hypertension: Secondary | ICD-10-CM | POA: Diagnosis not present

## 2017-12-26 DIAGNOSIS — Z79899 Other long term (current) drug therapy: Secondary | ICD-10-CM | POA: Diagnosis not present

## 2017-12-26 DIAGNOSIS — B351 Tinea unguium: Secondary | ICD-10-CM | POA: Diagnosis not present

## 2018-03-29 DIAGNOSIS — Z130 Encounter for screening for diseases of the blood and blood-forming organs and certain disorders involving the immune mechanism: Secondary | ICD-10-CM | POA: Diagnosis not present

## 2018-03-29 DIAGNOSIS — Z6825 Body mass index (BMI) 25.0-25.9, adult: Secondary | ICD-10-CM | POA: Diagnosis not present

## 2018-03-29 DIAGNOSIS — Z1389 Encounter for screening for other disorder: Secondary | ICD-10-CM | POA: Diagnosis not present

## 2018-03-29 DIAGNOSIS — Z01419 Encounter for gynecological examination (general) (routine) without abnormal findings: Secondary | ICD-10-CM | POA: Diagnosis not present

## 2018-03-29 DIAGNOSIS — Z113 Encounter for screening for infections with a predominantly sexual mode of transmission: Secondary | ICD-10-CM | POA: Diagnosis not present

## 2018-03-30 DIAGNOSIS — B009 Herpesviral infection, unspecified: Secondary | ICD-10-CM | POA: Insufficient documentation

## 2018-04-23 DIAGNOSIS — R300 Dysuria: Secondary | ICD-10-CM | POA: Diagnosis not present

## 2018-07-08 DIAGNOSIS — Z0100 Encounter for examination of eyes and vision without abnormal findings: Secondary | ICD-10-CM | POA: Diagnosis not present

## 2018-07-17 DIAGNOSIS — R300 Dysuria: Secondary | ICD-10-CM | POA: Diagnosis not present

## 2018-07-20 DIAGNOSIS — R300 Dysuria: Secondary | ICD-10-CM | POA: Diagnosis not present

## 2019-05-07 DIAGNOSIS — L819 Disorder of pigmentation, unspecified: Secondary | ICD-10-CM | POA: Diagnosis not present

## 2019-06-18 ENCOUNTER — Other Ambulatory Visit: Payer: Self-pay

## 2019-06-18 DIAGNOSIS — Z20822 Contact with and (suspected) exposure to covid-19: Secondary | ICD-10-CM

## 2019-06-20 LAB — NOVEL CORONAVIRUS, NAA: SARS-CoV-2, NAA: NOT DETECTED

## 2019-07-03 DIAGNOSIS — R300 Dysuria: Secondary | ICD-10-CM | POA: Diagnosis not present

## 2019-09-10 DIAGNOSIS — Z6836 Body mass index (BMI) 36.0-36.9, adult: Secondary | ICD-10-CM | POA: Diagnosis not present

## 2019-09-10 DIAGNOSIS — Z01419 Encounter for gynecological examination (general) (routine) without abnormal findings: Secondary | ICD-10-CM | POA: Diagnosis not present

## 2019-09-10 DIAGNOSIS — Z3009 Encounter for other general counseling and advice on contraception: Secondary | ICD-10-CM | POA: Diagnosis not present

## 2019-09-10 DIAGNOSIS — Z1389 Encounter for screening for other disorder: Secondary | ICD-10-CM | POA: Diagnosis not present

## 2019-11-14 DIAGNOSIS — Z13220 Encounter for screening for lipoid disorders: Secondary | ICD-10-CM | POA: Diagnosis not present

## 2019-11-14 DIAGNOSIS — Z0000 Encounter for general adult medical examination without abnormal findings: Secondary | ICD-10-CM | POA: Diagnosis not present

## 2019-11-14 DIAGNOSIS — Z131 Encounter for screening for diabetes mellitus: Secondary | ICD-10-CM | POA: Diagnosis not present

## 2019-11-25 ENCOUNTER — Ambulatory Visit: Payer: Managed Care, Other (non HMO) | Attending: Internal Medicine

## 2019-11-25 DIAGNOSIS — Z23 Encounter for immunization: Secondary | ICD-10-CM

## 2019-11-25 NOTE — Progress Notes (Signed)
   Covid-19 Vaccination Clinic  Name:  Heather Nixon    MRN: [id number] DOB: [birth year]  11/25/2019  [name] was observed post Covid-19 immunization for 15 minutes without incident. She was provided with Vaccine Information Sheet and instruction to access the V-Safe system.   [name] was instructed to call 911 with any severe reactions post vaccine: Marland Kitchen Difficulty breathing  . Swelling of face and throat  . A fast heartbeat  . A bad rash all over body  . Dizziness and weakness   Immunizations Administered    Name Date Dose VIS Date Route   Pfizer COVID-19 Vaccine 11/25/2019 10:50 AM 0.3 mL 08/09/2019 Intramuscular   Manufacturer: [place]   Lot: [id number]   [place]: [id number]

## 2019-12-18 ENCOUNTER — Ambulatory Visit: Payer: Managed Care, Other (non HMO) | Attending: Internal Medicine

## 2019-12-18 DIAGNOSIS — Z23 Encounter for immunization: Secondary | ICD-10-CM

## 2019-12-18 NOTE — Progress Notes (Signed)
   Covid-19 Vaccination Clinic  Name:  JOLINDA NEUNER    MRN: [id number] DOB: Feb 26, 1981  12/18/2019  Ms. Montour was observed post Covid-19 immunization for 15 minutes without incident. She was provided with Vaccine Information Sheet and instruction to access the V-Safe system.   Ms. Quaranta was instructed to call 911 with any severe reactions post vaccine: Marland Kitchen Difficulty breathing  . Swelling of face and throat  . A fast heartbeat  . A bad rash all over body  . Dizziness and weakness   Immunizations Administered    Name Date Dose VIS Date Route   Pfizer COVID-19 Vaccine 12/18/2019  9:38 AM 0.3 mL 10/23/2018 Intramuscular   Manufacturer: [place]   Lot: [id number]   [place]: [id number]

## 2020-02-03 DIAGNOSIS — R309 Painful micturition, unspecified: Secondary | ICD-10-CM | POA: Diagnosis not present

## 2020-02-03 DIAGNOSIS — R300 Dysuria: Secondary | ICD-10-CM | POA: Diagnosis not present

## 2020-03-12 ENCOUNTER — Encounter: Payer: Self-pay | Admitting: Nurse Practitioner

## 2020-03-12 ENCOUNTER — Telehealth: Payer: Self-pay | Admitting: Nurse Practitioner

## 2020-03-12 ENCOUNTER — Ambulatory Visit (INDEPENDENT_AMBULATORY_CARE_PROVIDER_SITE_OTHER): Payer: BC Managed Care – PPO | Admitting: Nurse Practitioner

## 2020-03-12 ENCOUNTER — Other Ambulatory Visit (INDEPENDENT_AMBULATORY_CARE_PROVIDER_SITE_OTHER): Payer: BC Managed Care – PPO

## 2020-03-12 VITALS — BP 118/76 | HR 88 | Ht 70.0 in | Wt 189.0 lb

## 2020-03-12 DIAGNOSIS — K600 Acute anal fissure: Secondary | ICD-10-CM

## 2020-03-12 DIAGNOSIS — K602 Anal fissure, unspecified: Secondary | ICD-10-CM | POA: Diagnosis not present

## 2020-03-12 LAB — CBC WITH DIFFERENTIAL/PLATELET
Basophils Absolute: 0.1 10*3/uL (ref 0.0–0.1)
Basophils Relative: 1 % (ref 0.0–3.0)
Eosinophils Absolute: 0 10*3/uL (ref 0.0–0.7)
Eosinophils Relative: 0.7 % (ref 0.0–5.0)
HCT: 40.6 % (ref 36.0–46.0)
Hemoglobin: 13.8 g/dL (ref 12.0–15.0)
Lymphocytes Relative: 35 % (ref 12.0–46.0)
Lymphs Abs: 1.9 10*3/uL (ref 0.7–4.0)
MCHC: 34.1 g/dL (ref 30.0–36.0)
MCV: 97.7 fl (ref 78.0–100.0)
Monocytes Absolute: 0.6 10*3/uL (ref 0.1–1.0)
Monocytes Relative: 11.4 % (ref 3.0–12.0)
Neutro Abs: 2.8 10*3/uL (ref 1.4–7.7)
Neutrophils Relative %: 51.9 % (ref 43.0–77.0)
Platelets: 235 10*3/uL (ref 150.0–400.0)
RBC: 4.15 Mil/uL (ref 3.87–5.11)
RDW: 12.4 % (ref 11.5–15.5)
WBC: 5.4 10*3/uL (ref 4.0–10.5)

## 2020-03-12 LAB — COMPREHENSIVE METABOLIC PANEL
ALT: 15 U/L (ref 0–35)
AST: 18 U/L (ref 0–37)
Albumin: 4.3 g/dL (ref 3.5–5.2)
Alkaline Phosphatase: 50 U/L (ref 39–117)
BUN: 12 mg/dL (ref 6–23)
CO2: 26 mEq/L (ref 19–32)
Calcium: 9.5 mg/dL (ref 8.4–10.5)
Chloride: 103 mEq/L (ref 96–112)
Creatinine, Ser: 0.66 mg/dL (ref 0.40–1.20)
GFR: 99.99 mL/min (ref >60.00)
Glucose, Bld: 87 mg/dL (ref 70–99)
Potassium: 4 mEq/L (ref 3.5–5.1)
Sodium: 137 mEq/L (ref 135–145)
Total Bilirubin: 0.7 mg/dL (ref 0.2–1.2)
Total Protein: 7.6 g/dL (ref 6.0–8.3)

## 2020-03-12 LAB — C-REACTIVE PROTEIN: CRP: <1 mg/dL (ref 0.5–20.0)

## 2020-03-12 MED ORDER — AMOXICILLIN-POT CLAVULANATE 875-125 MG PO TABS: 1.0000 | ORAL_TABLET | Freq: Two times a day (BID) | ORAL | 0 refills | Status: DC

## 2020-03-12 NOTE — Telephone Encounter (Signed)
[facility] called in reference to a referral that we sent for this pt. They are missing ov notes. Pt's appt is tomorrow morning.Pls fax it to [phone].

## 2020-03-12 NOTE — Patient Instructions (Signed)
If you are age 38 or older, your body mass index should be between 23-30. Your Body mass index is 27.12 kg/m. If this is out of the aforementioned range listed, please consider follow up with your Primary Care Provider.  If you are age 59 or younger, your body mass index should be between 19-25. Your Body mass index is 27.12 kg/m. If this is out of the aformentioned range listed, please consider follow up with your Primary Care Provider.   You have been scheduled for an appointment with Carlena Hurl, PA-C at [facility]. Your appointment is on 03/13/2020 at 9:30. Please arrive at 9:00am for registration. Make certain to bring a list of current medications, including any over the counter medications or vitamins. Also bring your co-pay if you have one as well as your insurance cards. [facility] is located at [address], [address]. Should you need to reschedule your appointment, please contact them at [phone].  Your provider has requested that you go to the basement level for lab work before leaving today. Press "B" on the elevator. The lab is located at the first door on the left as you exit the elevator.  We have sent the following medications to your pharmacy for you to pick up at your convenience: Augmentin 875 mg  Due to recent changes in healthcare laws, you may see the results of your imaging and laboratory studies on MyChart before your provider has had a chance to review them.  We understand that in some cases there may be results that are confusing or concerning to you. Not all laboratory results come back in the same time frame and the provider may be waiting for multiple results in order to interpret others.  Please give [place] 48 hours in order for your provider to thoroughly review all the results before contacting the office for clarification of your results.    Thank you for choosing [facility] Noralyn Pick, CRNP

## 2020-03-12 NOTE — Progress Notes (Signed)
03/12/2020 Heather Nixon [id number] 02-01-1981   CHIEF COMPLAINT:  Anal fissure   HISTORY OF PRESENT ILLNESS:  Heather Nixon is a 39 year old female with a past medical history of recurrent, UTI 2 mnths ago, pyelonephritis and anal fissure. She was last seen in office in 2017 by Dr. Henrene Pastor due to having an anal fissure. She was prescribed Diltiazem fissure ointment, Miralax and fiber and her fissure healed within a few months. She underwent a colonoscopy by Dr. Henrene Pastor 06/28/2016 which showed a hyperplastic polyp removed from the sigmoid colon. Next colonoscopy due 05/2021. Her father was diagnosed with colon cancer at the age of 47 and her paternal great grandmother had colon cancer. She presents to our office today with complaints of anal pain with bleeding which started 2 weeks ago. Her anal pain was severe to the point she could not sit down. She reported seeing bright red blood on the toilet tissue after each BM. She restarted Metamucil and Miralax and her BMs were less painful. No prior constipation or straining. She also soaked in the tub and used Witch haze wipes with some improvement. No fever. No unusual fatigue. No abdominal pain. No other complaints today.   Colonoscopy 06/28/2016 by Dr. Henrene Pastor: - A 5 mm polyp was found in the sigmoid colon. The polyp was removed with a cold snare. Resection and retrieval were complete.  Past Medical History:  Diagnosis Date  . Anal fissure   . Hx of pyelonephritis   . Hx of varicella    Past Surgical History:  Procedure Laterality Date  . NO PAST SURGERIES     Social History: Married. Nonsmoker. 10 alcoholic drinks monthly or less. No drug use.   Family History: Father diagnosed with colon cancer age 31. Paternal great grandmother with history of  colon cancer. Mother age 31 health. Father 22.   Allergies  Allergen Reactions  . Macrobid [Nitrofurantoin Monohyd Macro] Nausea And Vomiting      Outpatient Encounter  Medications as of 03/12/2020  Medication Sig  . ibuprofen (ADVIL,MOTRIN) 600 MG tablet Take 1 tablet (600 mg total) by mouth every 6 (six) hours.  Marland Kitchen levonorgestrel (MIRENA, 52 MG,) 20 MCG/24HR IUD 1 each by Intrauterine route once.  . loratadine (CLARITIN) 10 MG tablet Take 10 mg by mouth daily as needed for allergies.  Marland Kitchen MULTIPLE VITAMIN PO 2 chewables daily  . [DISCONTINUED] diltiazem 2 % GEL Apply 1 application topically 5 (five) times daily.  . [DISCONTINUED] 0.9 %  sodium chloride infusion    No facility-administered encounter medications on file as of 03/12/2020.     REVIEW OF SYSTEMS: All other systems reviewed and negative except where noted in the History of Present Illness.   PHYSICAL EXAM: BP 118/76   Pulse 88   Ht 5\' 10"  (1.778 m)   Wt 189 lb (85.7 kg)   BMI 27.12 kg/m  General: Well developed 39 year old female in no acute distress. Head: Normocephalic and atraumatic. Eyes:  Sclerae non-icteric, conjunctive pink. Ears: Normal auditory acuity. Mouth: Dentition intact. No ulcers or lesions.  Neck: Supple, no lymphadenopathy or thyromegaly.  Lungs: Clear bilaterally to auscultation without wheezes, crackles or rhonchi. Heart: Regular rate and rhythm. No murmur, rub or gallop appreciated.  Abdomen: Soft, nontender, non distended. No masses. No hepatosplenomegaly. Normoactive bowel sounds x 4 quadrants.  Rectal: Posterior fissure open with yellow/tan exudate, moderately tender without active bleeding. Limited rectal exam due to pain.  Musculoskeletal: Symmetrical with no gross  deformities. Skin: Warm and dry. No rash or lesions on visible extremities. Extremities: No edema. Neurological: Alert oriented x 4, no focal deficits.  Psychological:  Alert and cooperative. Normal mood and affect.  ASSESSMENT AND PLAN:  78. 39 year old female with a posterior anal fissure with exudate concerning for anal fissure with abscess.  --CBC, CRP, CMP -Augmentin 875mg  one po bid x 10  days -Request urgent consult with [facility] anal fissure clinic, appointment scheduled 03/13/2020 at 9:30am -Metamucil and Miralax as tolerated -Sitz bath 10 min bid PRN as tolerated -Follow up in office in 4  -6 weeks with Dr. Henrene Pastor -Call office if symptoms worsen  2. History of hyperplastic polyp. Family (Father diagnosed age 61) history of colon cancer -Next colonoscopy due 05/2021    CC:  Kathyrn Lass, MD

## 2020-03-12 NOTE — Telephone Encounter (Signed)
Faxed today's note as requested. Labs are pending.

## 2020-03-13 DIAGNOSIS — K602 Anal fissure, unspecified: Secondary | ICD-10-CM | POA: Diagnosis not present

## 2020-03-13 NOTE — Progress Notes (Signed)
Noted  

## 2020-10-05 DIAGNOSIS — Z6828 Body mass index (BMI) 28.0-28.9, adult: Secondary | ICD-10-CM | POA: Diagnosis not present

## 2020-10-05 DIAGNOSIS — Z1389 Encounter for screening for other disorder: Secondary | ICD-10-CM | POA: Diagnosis not present

## 2020-10-05 DIAGNOSIS — Z01419 Encounter for gynecological examination (general) (routine) without abnormal findings: Secondary | ICD-10-CM | POA: Diagnosis not present

## 2020-10-05 DIAGNOSIS — Z113 Encounter for screening for infections with a predominantly sexual mode of transmission: Secondary | ICD-10-CM | POA: Diagnosis not present

## 2020-10-05 DIAGNOSIS — Z130 Encounter for screening for diseases of the blood and blood-forming organs and certain disorders involving the immune mechanism: Secondary | ICD-10-CM | POA: Diagnosis not present

## 2020-12-17 DIAGNOSIS — F419 Anxiety disorder, unspecified: Secondary | ICD-10-CM | POA: Diagnosis not present

## 2020-12-17 DIAGNOSIS — F5102 Adjustment insomnia: Secondary | ICD-10-CM | POA: Diagnosis not present

## 2020-12-17 DIAGNOSIS — F4329 Adjustment disorder with other symptoms: Secondary | ICD-10-CM | POA: Diagnosis not present

## 2021-02-12 ENCOUNTER — Other Ambulatory Visit: Payer: Self-pay

## 2021-02-15 ENCOUNTER — Ambulatory Visit: Payer: BC Managed Care – PPO | Admitting: Podiatrist

## 2021-07-19 DIAGNOSIS — R350 Frequency of micturition: Secondary | ICD-10-CM | POA: Diagnosis not present

## 2021-07-20 DIAGNOSIS — R350 Frequency of micturition: Secondary | ICD-10-CM | POA: Diagnosis not present

## 2021-07-23 ENCOUNTER — Encounter: Payer: Self-pay | Admitting: Internal Medicine

## 2021-09-22 DIAGNOSIS — L579 Skin changes due to chronic exposure to nonionizing radiation, unspecified: Secondary | ICD-10-CM | POA: Diagnosis not present

## 2021-09-22 DIAGNOSIS — L811 Chloasma: Secondary | ICD-10-CM | POA: Diagnosis not present

## 2021-09-22 DIAGNOSIS — L814 Other melanin hyperpigmentation: Secondary | ICD-10-CM | POA: Diagnosis not present

## 2021-10-11 DIAGNOSIS — Z124 Encounter for screening for malignant neoplasm of cervix: Secondary | ICD-10-CM | POA: Diagnosis not present

## 2021-10-11 DIAGNOSIS — Z130 Encounter for screening for diseases of the blood and blood-forming organs and certain disorders involving the immune mechanism: Secondary | ICD-10-CM | POA: Diagnosis not present

## 2021-10-11 DIAGNOSIS — Z01419 Encounter for gynecological examination (general) (routine) without abnormal findings: Secondary | ICD-10-CM | POA: Diagnosis not present

## 2021-10-11 DIAGNOSIS — Z6830 Body mass index (BMI) 30.0-30.9, adult: Secondary | ICD-10-CM | POA: Diagnosis not present

## 2021-10-12 DIAGNOSIS — Z1231 Encounter for screening mammogram for malignant neoplasm of breast: Secondary | ICD-10-CM | POA: Diagnosis not present

## 2021-10-15 DIAGNOSIS — L814 Other melanin hyperpigmentation: Secondary | ICD-10-CM | POA: Diagnosis not present

## 2021-10-15 DIAGNOSIS — D485 Neoplasm of uncertain behavior of skin: Secondary | ICD-10-CM | POA: Diagnosis not present

## 2021-10-19 ENCOUNTER — Other Ambulatory Visit: Payer: Self-pay | Admitting: Obstetrics and Gynecology

## 2021-10-19 DIAGNOSIS — R928 Other abnormal and inconclusive findings on diagnostic imaging of breast: Secondary | ICD-10-CM

## 2021-10-20 ENCOUNTER — Other Ambulatory Visit: Payer: BC Managed Care – PPO

## 2021-10-22 ENCOUNTER — Other Ambulatory Visit: Payer: Self-pay

## 2021-10-22 ENCOUNTER — Ambulatory Visit
Admission: RE | Admit: 2021-10-22 | Discharge: 2021-10-22 | Disposition: A | Discharge status: Home / Self Care | Payer: BC Managed Care – PPO | Source: Ambulatory Visit | Attending: Obstetrics and Gynecology | Admitting: Obstetrics and Gynecology

## 2021-10-22 DIAGNOSIS — R928 Other abnormal and inconclusive findings on diagnostic imaging of breast: Secondary | ICD-10-CM

## 2021-10-22 DIAGNOSIS — R922 Inconclusive mammogram: Secondary | ICD-10-CM | POA: Diagnosis not present

## 2021-11-09 DIAGNOSIS — L579 Skin changes due to chronic exposure to nonionizing radiation, unspecified: Secondary | ICD-10-CM | POA: Diagnosis not present

## 2021-11-09 DIAGNOSIS — L905 Scar conditions and fibrosis of skin: Secondary | ICD-10-CM | POA: Diagnosis not present

## 2021-11-09 DIAGNOSIS — L814 Other melanin hyperpigmentation: Secondary | ICD-10-CM | POA: Diagnosis not present

## 2021-12-17 ENCOUNTER — Ambulatory Visit (AMBULATORY_SURGERY_CENTER): Payer: BC Managed Care – PPO | Admitting: *Deleted

## 2021-12-17 VITALS — Ht 70.0 in | Wt 211.0 lb

## 2021-12-17 DIAGNOSIS — Z800 Family history of malignant neoplasm of digestive organs: Secondary | ICD-10-CM

## 2021-12-17 MED ORDER — NA SULFATE-K SULFATE-MG SULF 17.5-3.13-1.6 GM/177ML PO SOLN: 1.0000 | Freq: Once | ORAL | 0 refills | Status: AC

## 2021-12-17 NOTE — Progress Notes (Signed)

## 2021-12-29 ENCOUNTER — Encounter: Payer: Self-pay | Admitting: Internal Medicine

## 2022-01-07 ENCOUNTER — Ambulatory Visit (AMBULATORY_SURGERY_CENTER): Payer: BC Managed Care – PPO | Admitting: Internal Medicine

## 2022-01-07 ENCOUNTER — Encounter: Payer: Self-pay | Admitting: Internal Medicine

## 2022-01-07 VITALS — BP 112/73 | HR 74 | Temp 96.8°F | Resp 12 | Ht 70.0 in | Wt 211.0 lb

## 2022-01-07 DIAGNOSIS — Z800 Family history of malignant neoplasm of digestive organs: Secondary | ICD-10-CM

## 2022-01-07 DIAGNOSIS — Z09 Encounter for follow-up examination after completed treatment for conditions other than malignant neoplasm: Secondary | ICD-10-CM | POA: Diagnosis not present

## 2022-01-07 DIAGNOSIS — Z1211 Encounter for screening for malignant neoplasm of colon: Secondary | ICD-10-CM | POA: Diagnosis not present

## 2022-01-07 DIAGNOSIS — Z86010 Personal history of colonic polyps: Secondary | ICD-10-CM | POA: Diagnosis not present

## 2022-01-07 MED ORDER — SODIUM CHLORIDE 0.9 % IV SOLN: 500.0000 mL | Freq: Once | INTRAVENOUS | Status: DC

## 2022-01-07 NOTE — Patient Instructions (Signed)
Please read handouts provided. ?Continue present medications. ?Repeat colonoscopy in 5 years. ? ? ?YOU HAD AN ENDOSCOPIC PROCEDURE TODAY AT [facility]:   Refer to the procedure report that was given to you for any specific questions about what was found during the examination.  If the procedure report does not answer your questions, please call your gastroenterologist to clarify.  If you requested that your care partner not be given the details of your procedure findings, then the procedure report has been included in a sealed envelope for you to review at your convenience later. ? ?YOU SHOULD EXPECT: Some feelings of bloating in the abdomen. Passage of more gas than usual.  Walking can help get rid of the air that was put into your GI tract during the procedure and reduce the bloating. If you had a lower endoscopy (such as a colonoscopy or flexible sigmoidoscopy) you may notice spotting of blood in your stool or on the toilet paper. If you underwent a bowel prep for your procedure, you may not have a normal bowel movement for a few days. ? ?Please Note:  You might notice some irritation and congestion in your nose or some drainage.  This is from the oxygen used during your procedure.  There is no need for concern and it should clear up in a day or so. ? ?SYMPTOMS TO REPORT IMMEDIATELY: ? ?Following lower endoscopy (colonoscopy or flexible sigmoidoscopy): ? Excessive amounts of blood in the stool ? Significant tenderness or worsening of abdominal pains ? Swelling of the abdomen that is new, acute ? Fever of 100?F or higher ? ? ?For urgent or emergent issues, a gastroenterologist can be reached at any hour by calling [phone]. ?Do not use MyChart messaging for urgent concerns.  ? ? ?DIET:  We do recommend a small meal at first, but then you may proceed to your regular diet.  Drink plenty of fluids but you should avoid alcoholic beverages for 24 hours. ? ?ACTIVITY:  You should plan to take it  easy for the rest of today and you should NOT DRIVE or use heavy machinery until tomorrow (because of the sedation medicines used during the test).   ? ?FOLLOW UP: ?Our staff will call the number listed on your records 48-72 hours following your procedure to check on you and address any questions or concerns that you may have regarding the information given to you following your procedure. If we do not reach you, we will leave a message.  We will attempt to reach you two times.  During this call, we will ask if you have developed any symptoms of COVID 19. If you develop any symptoms (ie: fever, flu-like symptoms, shortness of breath, cough etc.) before then, please call [phone].  If you test positive for Covid 19 in the 2 weeks post procedure, please call and report this information to [place].   ? ?If any biopsies were taken you will be contacted by phone or by letter within the next 1-3 weeks.  Please call us at [phone] if you have not heard about the biopsies in 3 weeks.  ? ? ?SIGNATURES/CONFIDENTIALITY: ?You and/or your care partner have signed paperwork which will be entered into your electronic medical record.  These signatures attest to the fact that that the information above on your After Visit Summary has been reviewed and is understood.  Full responsibility of the confidentiality of this discharge information lies with you and/or your care-partner.  ?

## 2022-01-07 NOTE — Progress Notes (Signed)
Pt's states no medical or surgical changes since previsit or office visit. 

## 2022-01-07 NOTE — Progress Notes (Signed)
A and O x3. Report to RN. Tolerated MAC anesthesia well. 

## 2022-01-07 NOTE — Progress Notes (Signed)
HISTORY OF PRESENT ILLNESS: ? ?Heather Nixon is a 41 y.o. female who presents today for high rescreening colonoscopy due to family history of colon cancer in her father less than age 23.  No complaints ? ?REVIEW OF SYSTEMS: ? ?All non-GI ROS negative except for ? ?Past Medical History:  ?Diagnosis Date  ? Allergy   ? SEASONAL  ? Anal fissure   ? Hx of pyelonephritis   ? Hx of varicella   ? ? ?Past Surgical History:  ?Procedure Laterality Date  ? COLONOSCOPY    ? NO PAST SURGERIES    ? WISDOM TOOTH EXTRACTION    ? ? ?Social History ?Heather Nixon  reports that she has never smoked. She has never used smokeless tobacco. She reports that she does not drink alcohol and does not use drugs. ? ?family history includes Colon cancer in an other family member; Colon cancer (age of onset: 45) in her father; Colon polyps in her brother; Hypertension in her maternal grandmother. ? ?Allergies  ?Allergen Reactions  ? Codeine Nausea Only  ? Macrobid [employer] Macro] Nausea And Vomiting  ? ? ?  ? ?PHYSICAL EXAMINATION: ? ?Vital signs: BP (!) 91/49   Pulse 93   Temp (!) 96.8 ?F (36 ?C)   Resp 14   Ht '5\' 10"'$  (1.778 m)   Wt 211 lb (95.7 kg)   SpO2 97%   BMI 30.28 kg/m?  ?General: Well-developed, well-nourished, no acute distress ?HEENT: Sclerae are anicteric, conjunctiva pink. Oral mucosa intact ?Lungs: Clear ?Heart: Regular ?Abdomen: soft, nontender, nondistended, no obvious ascites, no peritoneal signs, normal bowel sounds. No organomegaly. ?Extremities: No edema ?Psychiatric: alert and oriented x3. Cooperative  ? ? ? ?ASSESSMENT: ? ?Colon cancer screening.  High risk due to family history ? ? ?PLAN: ? ? ?Colonoscopy ? ? ? ?  ?

## 2022-01-07 NOTE — Op Note (Addendum)
[place] ?Patient Name: Heather Nixon ?Procedure Date: 01/07/2022 7:36 AM ?MRN: [id number] ?Endoscopist: Docia Chuck. Henrene Pastor , MD ?Age: 41 ?Referring MD:  ?Date of Birth: 03-06-1981 ?Gender: Female ?Account #: [id number] ?Procedure:                Colonoscopy ?Indications:              Screening in patient at increased risk: Colorectal  ?                          cancer in father before age 70. Negative index exam  ?                          2017 ?Medicines:                Monitored Anesthesia Care ?Procedure:                Pre-Anesthesia Assessment: ?                          - Prior to the procedure, a History and Physical  ?                          was performed, and patient medications and  ?                          allergies were reviewed. The patient's tolerance of  ?                          previous anesthesia was also reviewed. The risks  ?                          and benefits of the procedure and the sedation  ?                          options and risks were discussed with the patient.  ?                          All questions were answered, and informed consent  ?                          was obtained. Prior Anticoagulants: The patient has  ?                          taken no previous anticoagulant or antiplatelet  ?                          agents. ASA Grade Assessment: II - A patient with  ?                          mild systemic disease. After reviewing the risks  ?                          and benefits, the patient was deemed in  ?  satisfactory condition to undergo the procedure. ?                          After obtaining informed consent, the colonoscope  ?                          was passed under direct vision. Throughout the  ?                          procedure, the patient's blood pressure, pulse, and  ?                          oxygen saturations were monitored continuously. The  ?                          CF [id number] #[id number] was introduced through the  anus  ?                          and advanced to the the cecum, identified by  ?                          appendiceal orifice and ileocecal valve. The  ?                          ileocecal valve, appendiceal orifice, and rectum  ?                          were photographed. The quality of the bowel  ?                          preparation was excellent. The colonoscopy was  ?                          performed without difficulty. The patient tolerated  ?                          the procedure well. The bowel preparation used was  ?                          SUPREP via split dose instruction. ?Scope In: 8:47:04 AM ?Scope Out: 8:57:24 AM ?Scope Withdrawal Time: 0 hours 7 minutes 12 seconds  ?Total Procedure Duration: 0 hours 10 minutes 20 seconds  ?Findings:                 The entire examined colon appeared normal on direct  ?                          and retroflexion views... ?Complications:            No immediate complications. Estimated blood loss:  ?                          None. ?Estimated Blood Loss:     Estimated blood loss: none. ?Impression:               - The  entire examined colon is normal on direct and  ?                          retroflexion views. ?                          - No specimens collected. ?Recommendation:           - Repeat colonoscopy in 5 years for screening  ?                          purposes. ?                          - Patient has a contact number available for  ?                          emergencies. The signs and symptoms of potential  ?                          delayed complications were discussed with the  ?                          patient. Return to normal activities tomorrow.  ?                          Written discharge instructions were provided to the  ?                          patient. ?                          - Resume previous diet. ?                          - Continue present medications. ?Docia Chuck. Henrene Pastor, MD ?01/07/2022 9:08:42 AM ?This report has been signed  electronically. ?

## 2022-01-11 ENCOUNTER — Telehealth: Payer: Self-pay

## 2022-01-11 ENCOUNTER — Telehealth: Payer: Self-pay | Admitting: *Deleted

## 2022-01-11 NOTE — Telephone Encounter (Signed)
Left message on follow up call. 

## 2022-01-11 NOTE — Telephone Encounter (Signed)
Attempted f/u phone. No answer. Left message.  ?

## 2022-03-08 DIAGNOSIS — B351 Tinea unguium: Secondary | ICD-10-CM | POA: Diagnosis not present

## 2022-03-22 DIAGNOSIS — B351 Tinea unguium: Secondary | ICD-10-CM | POA: Diagnosis not present

## 2022-04-05 DIAGNOSIS — B351 Tinea unguium: Secondary | ICD-10-CM | POA: Diagnosis not present

## 2022-04-19 DIAGNOSIS — B351 Tinea unguium: Secondary | ICD-10-CM | POA: Diagnosis not present

## 2022-05-03 DIAGNOSIS — B351 Tinea unguium: Secondary | ICD-10-CM | POA: Diagnosis not present

## 2022-08-02 DIAGNOSIS — B351 Tinea unguium: Secondary | ICD-10-CM | POA: Diagnosis not present

## 2022-10-26 DIAGNOSIS — Z01419 Encounter for gynecological examination (general) (routine) without abnormal findings: Secondary | ICD-10-CM | POA: Diagnosis not present

## 2022-10-26 DIAGNOSIS — Z1231 Encounter for screening mammogram for malignant neoplasm of breast: Secondary | ICD-10-CM | POA: Diagnosis not present

## 2022-10-26 DIAGNOSIS — Z130 Encounter for screening for diseases of the blood and blood-forming organs and certain disorders involving the immune mechanism: Secondary | ICD-10-CM | POA: Diagnosis not present

## 2023-05-27 IMAGING — US US BREAST*L* LIMITED INC AXILLA
1 series · 6 of 6 positions shown · non-contrast
Comparison: 10/12/2021 baseline screening mammogram

CLINICAL DATA: 41-year-old female for further evaluation of
possible LEFT breast asymmetry on baseline screening mammogram.

EXAM:
DIGITAL DIAGNOSTIC UNILATERAL LEFT MAMMOGRAM WITH TOMOSYNTHESIS AND
CAD; ULTRASOUND LEFT BREAST LIMITED
TECHNIQUE: Left digital diagnostic mammography and breast tomosynthesis was
performed. The images were evaluated with computer-aided detection.;
Targeted ultrasound examination of the left breast was performed.

[Series 1: us breast*left* limited inc axilla · 0.06mm/px · 6 of 6 slices shown]
[im 1/6]
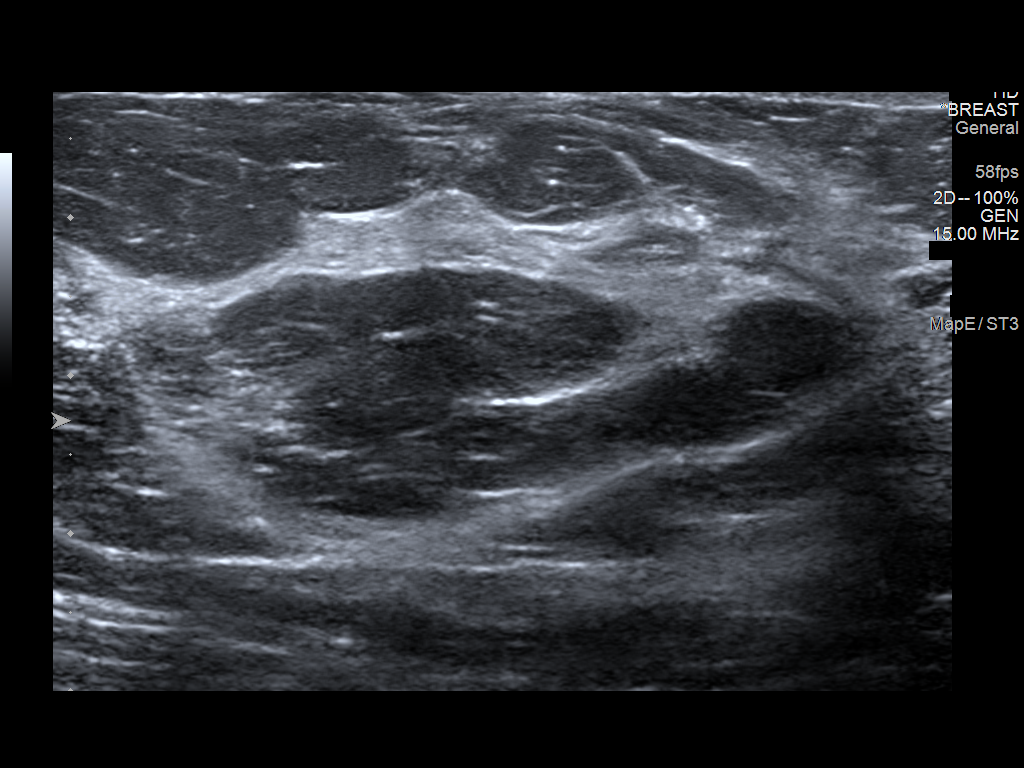
[im 2/6]
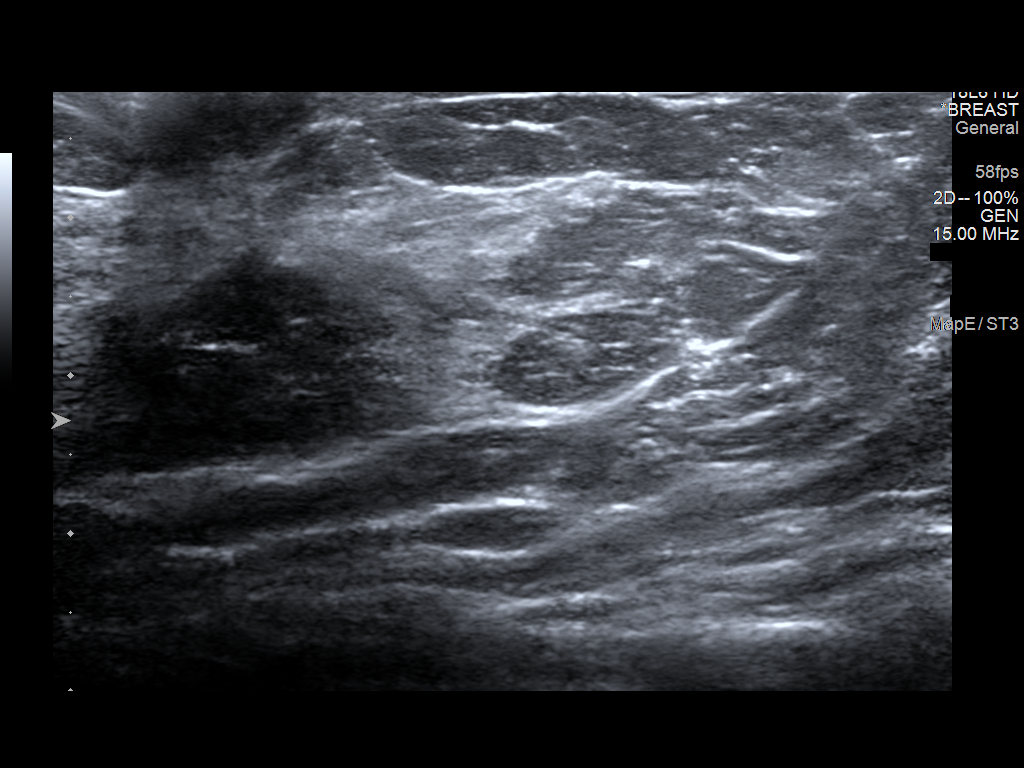
[im 3/6]
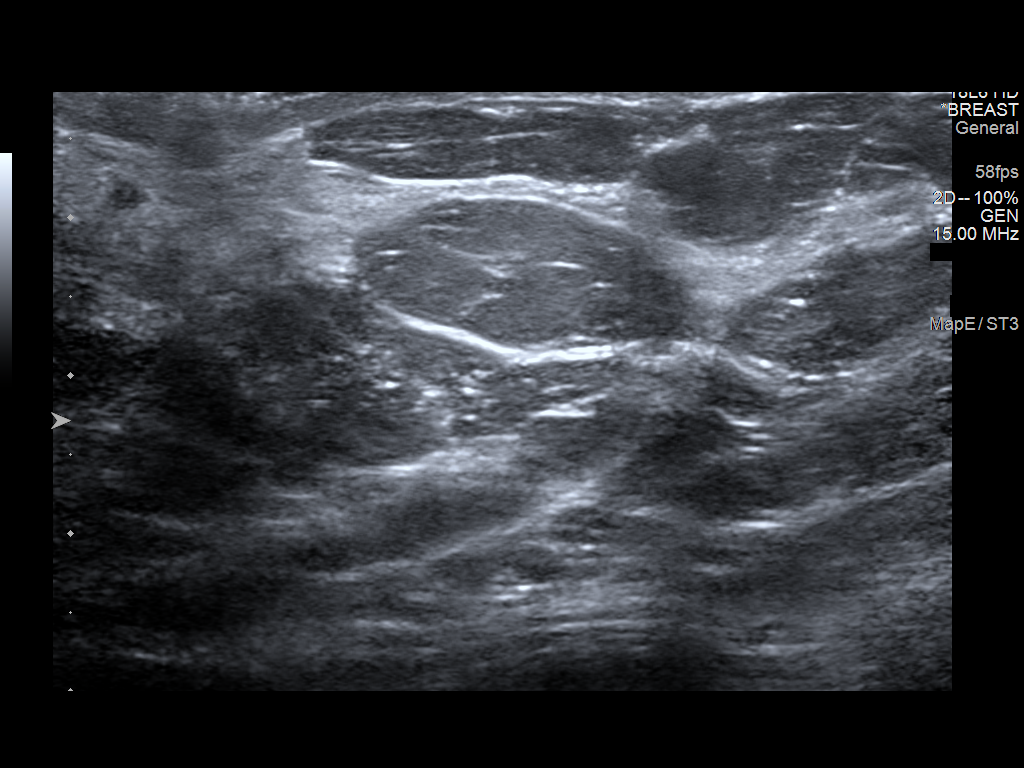
[im 4/6]
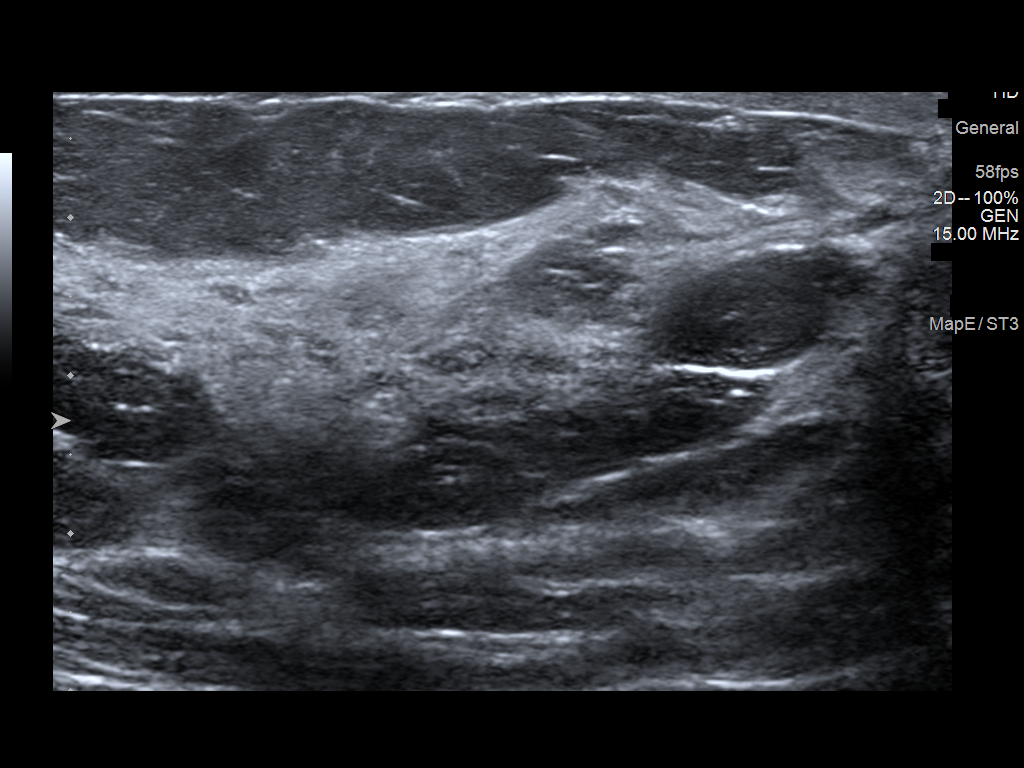
[im 5/6]
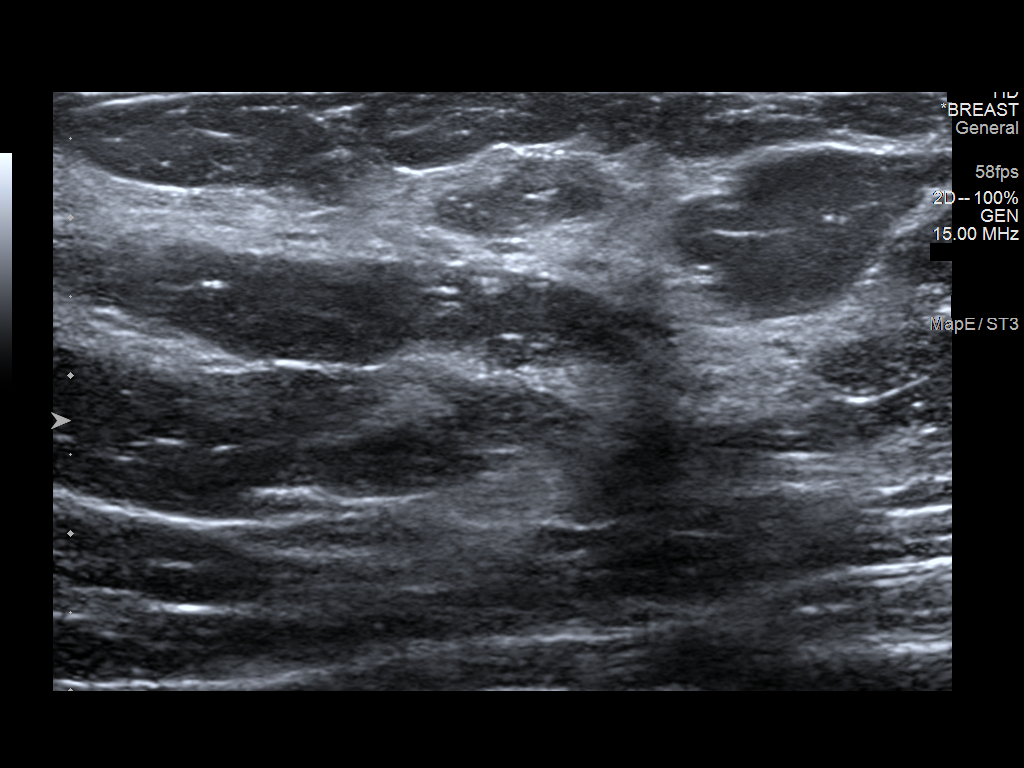
[im 6/6]
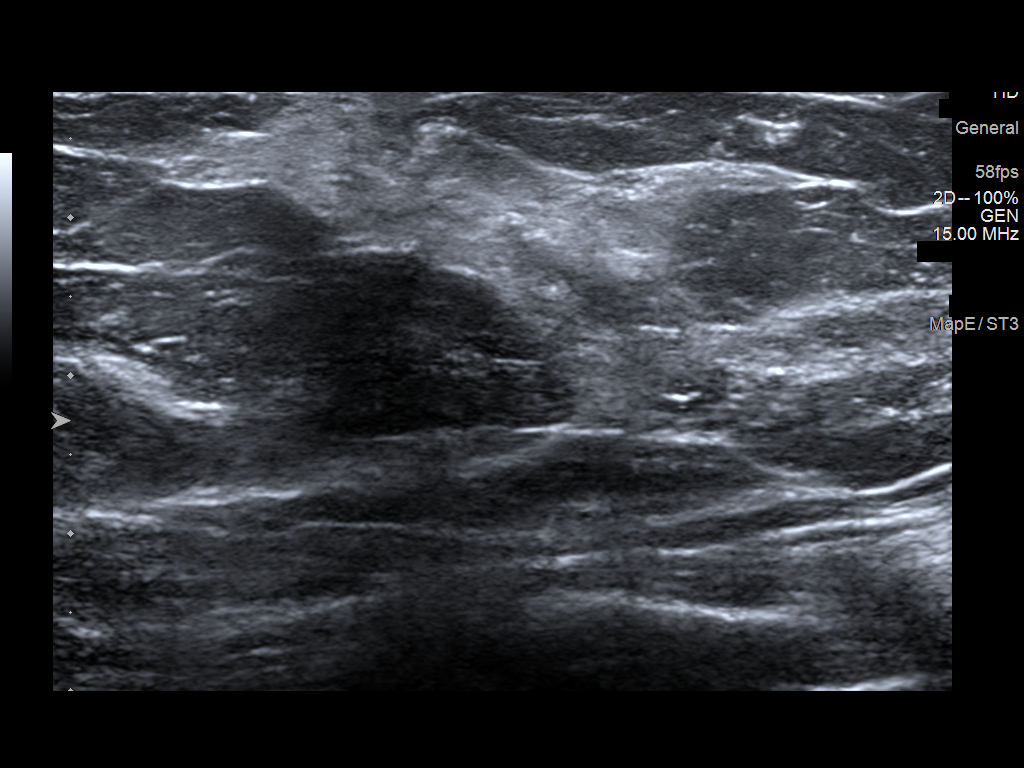

[6 of 6 positions shown; findings below may reference images not displayed]

ACR Breast Density Category b: There are scattered areas of
fibroglandular density.
FINDINGS: 2D/3D spot compression views of the LEFT breast demonstrate no
persistent suspicious abnormality in the area of the LEFT breast
screening study finding. Scattered fibroglandular densities in this
area are identified.

Targeted ultrasound is performed, showing no sonographic
abnormalities in the entire expected area of the screening study
finding.
IMPRESSION: No persistent mammographic or sonographic abnormality in the area of
the screening study finding.

RECOMMENDATION:
Bilateral screening mammogram in 1 year.

I have discussed the findings and recommendations with the patient.
If applicable, a reminder letter will be sent to the patient
regarding the next appointment.

BI-RADS CATEGORY  1: Negative.

## 2023-05-27 IMAGING — MG MM DIGITAL DIAGNOSTIC UNILAT*L* W/ TOMO W/ CAD
8 series · 8 of 24 positions shown · non-contrast
Comparison: 10/12/2021 baseline screening mammogram

CLINICAL DATA: 41-year-old female for further evaluation of
possible LEFT breast asymmetry on baseline screening mammogram.

EXAM:
DIGITAL DIAGNOSTIC UNILATERAL LEFT MAMMOGRAM WITH TOMOSYNTHESIS AND
CAD; ULTRASOUND LEFT BREAST LIMITED
TECHNIQUE: Left digital diagnostic mammography and breast tomosynthesis was
performed. The images were evaluated with computer-aided detection.;
Targeted ultrasound examination of the left breast was performed.

[L MLO synth-2D]
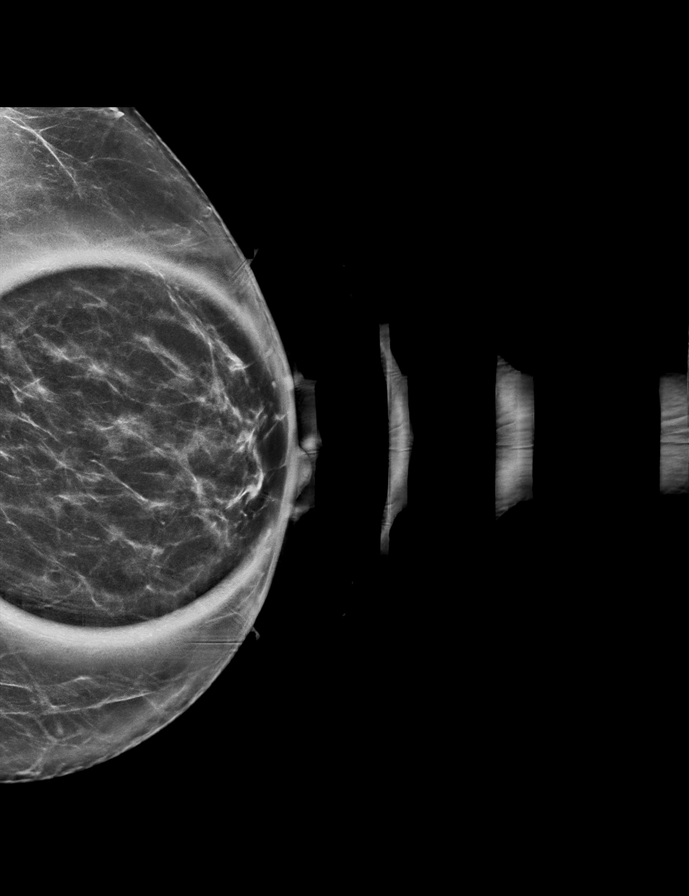

[L CC synth-2D (1 of 3)]
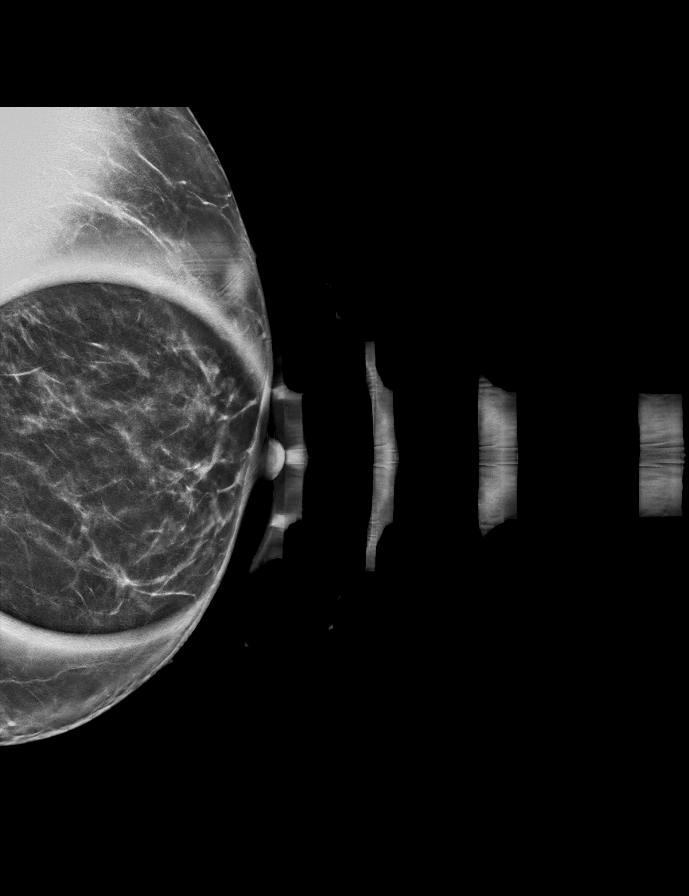

[L CC synth-2D (2 of 3)]
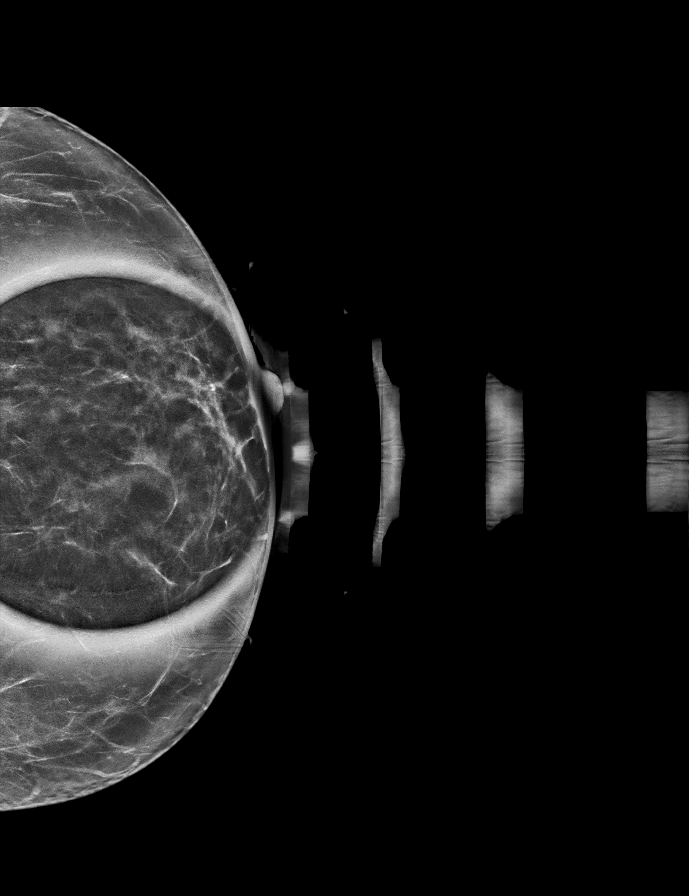

[L CC synth-2D (3 of 3)]
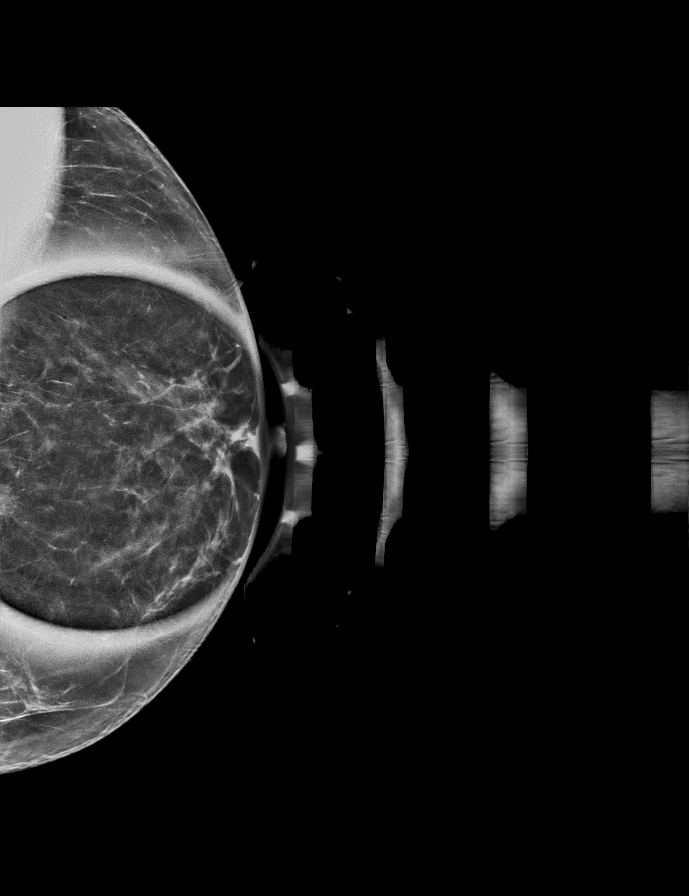

[L CC tomo (1 of 3) · tomo slice 31/61.0]
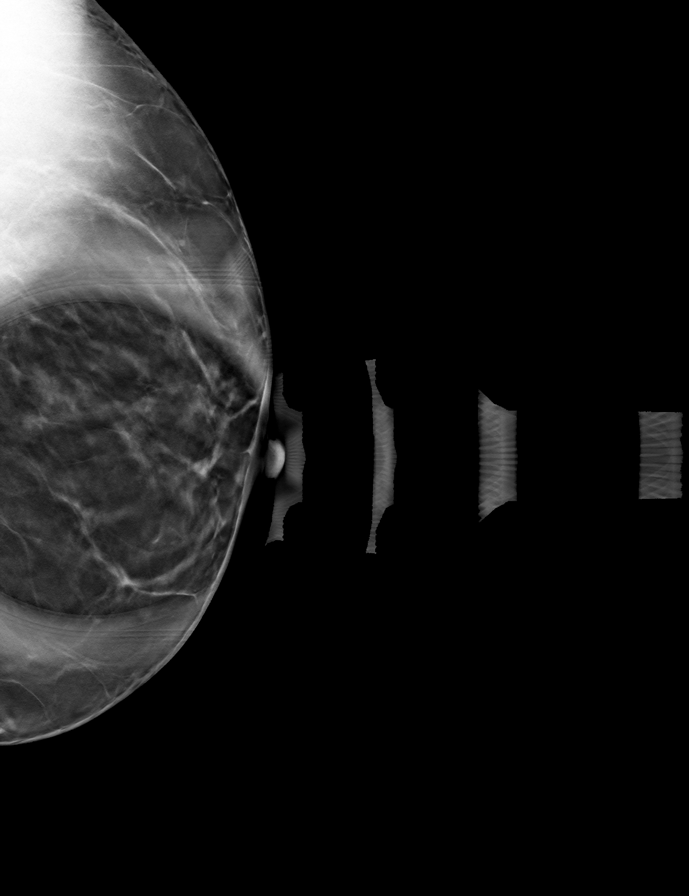

[L MLO tomo · tomo slice 37/72.0]
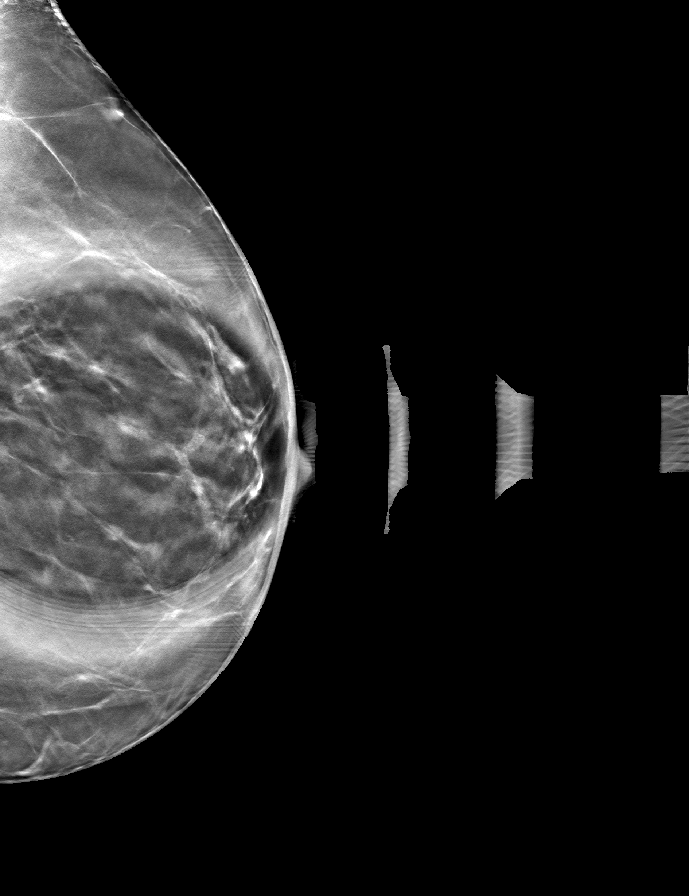

[L CC tomo (2 of 3) · tomo slice 33/65.0]
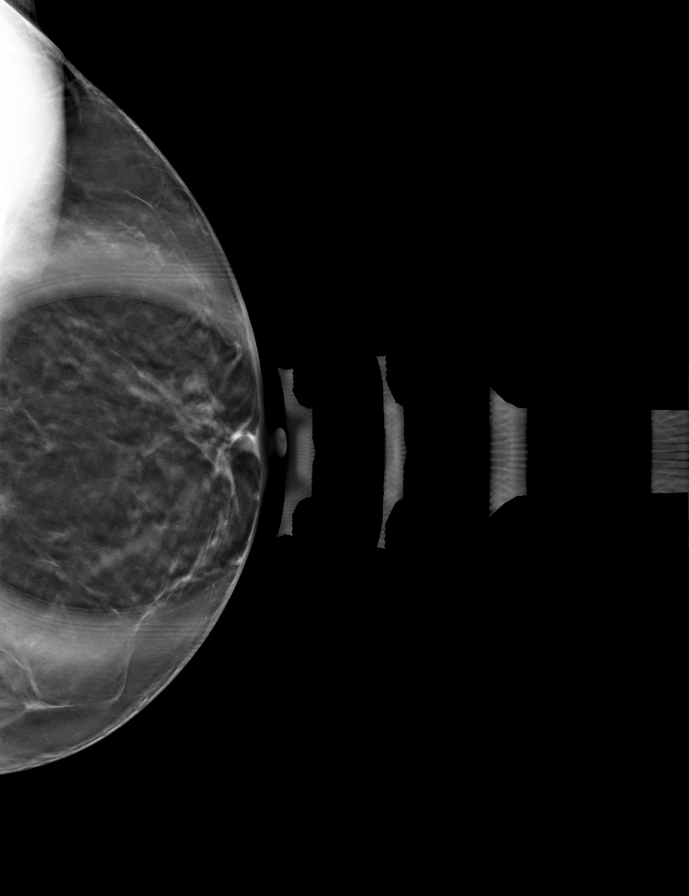

[L CC tomo (3 of 3) · tomo slice 31/62.0]
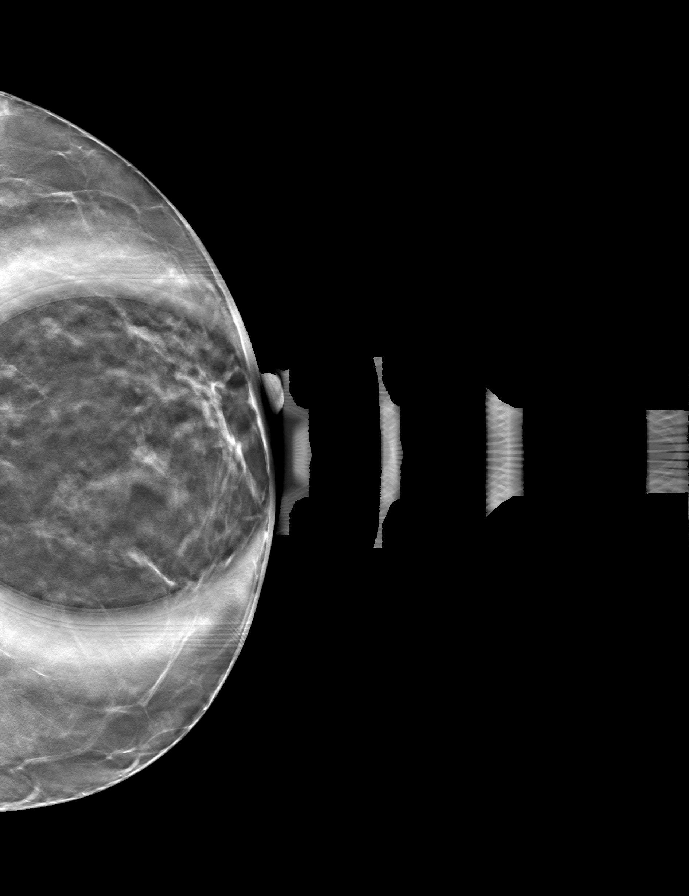

[8 of 24 positions shown; findings below may reference images not displayed]

ACR Breast Density Category b: There are scattered areas of
fibroglandular density.
FINDINGS: 2D/3D spot compression views of the LEFT breast demonstrate no
persistent suspicious abnormality in the area of the LEFT breast
screening study finding. Scattered fibroglandular densities in this
area are identified.

Targeted ultrasound is performed, showing no sonographic
abnormalities in the entire expected area of the screening study
finding.
IMPRESSION: No persistent mammographic or sonographic abnormality in the area of
the screening study finding.

RECOMMENDATION:
Bilateral screening mammogram in 1 year.

I have discussed the findings and recommendations with the patient.
If applicable, a reminder letter will be sent to the patient
regarding the next appointment.

BI-RADS CATEGORY  1: Negative.
